# Patient Record
Sex: Female | Born: 1955
Health system: Southern US, Community
[De-identification: ages and names within clinical notes are randomized; demographics above are authoritative.]

## PROBLEM LIST (undated history)

## (undated) DIAGNOSIS — S82891A Other fracture of right lower leg, initial encounter for closed fracture: Secondary | ICD-10-CM

## (undated) DIAGNOSIS — N200 Calculus of kidney: Secondary | ICD-10-CM

## (undated) DIAGNOSIS — F419 Anxiety disorder, unspecified: Secondary | ICD-10-CM

## (undated) DIAGNOSIS — T4145XA Adverse effect of unspecified anesthetic, initial encounter: Secondary | ICD-10-CM

## (undated) DIAGNOSIS — M1990 Unspecified osteoarthritis, unspecified site: Secondary | ICD-10-CM

## (undated) DIAGNOSIS — M797 Fibromyalgia: Secondary | ICD-10-CM

## (undated) DIAGNOSIS — Z9889 Other specified postprocedural states: Secondary | ICD-10-CM

## (undated) DIAGNOSIS — R896 Abnormal cytological findings in specimens from other organs, systems and tissues: Secondary | ICD-10-CM

## (undated) DIAGNOSIS — F329 Major depressive disorder, single episode, unspecified: Secondary | ICD-10-CM

## (undated) DIAGNOSIS — M719 Bursopathy, unspecified: Secondary | ICD-10-CM

## (undated) DIAGNOSIS — E039 Hypothyroidism, unspecified: Secondary | ICD-10-CM

## (undated) DIAGNOSIS — T8859XA Other complications of anesthesia, initial encounter: Secondary | ICD-10-CM

## (undated) DIAGNOSIS — I10 Essential (primary) hypertension: Secondary | ICD-10-CM

## (undated) DIAGNOSIS — F32A Depression, unspecified: Secondary | ICD-10-CM

## (undated) DIAGNOSIS — R112 Nausea with vomiting, unspecified: Secondary | ICD-10-CM

## (undated) DIAGNOSIS — N951 Menopausal and female climacteric states: Secondary | ICD-10-CM

## (undated) DIAGNOSIS — N3010 Interstitial cystitis (chronic) without hematuria: Secondary | ICD-10-CM

## (undated) HISTORY — DX: Hypothyroidism, unspecified: E03.9

## (undated) HISTORY — DX: Calculus of kidney: N20.0

## (undated) HISTORY — DX: Depression, unspecified: F32.A

## (undated) HISTORY — PX: CARPAL TUNNEL RELEASE: SHX101

## (undated) HISTORY — DX: Interstitial cystitis (chronic) without hematuria: N30.10

## (undated) HISTORY — DX: Menopausal and female climacteric states: N95.1

## (undated) HISTORY — DX: Bursopathy, unspecified: M71.9

## (undated) HISTORY — DX: Other fracture of right lower leg, initial encounter for closed fracture: S82.891A

## (undated) HISTORY — DX: Abnormal cytological findings in specimens from other organs, systems and tissues: R89.6

## (undated) HISTORY — DX: Major depressive disorder, single episode, unspecified: F32.9

## (undated) HISTORY — DX: Essential (primary) hypertension: I10

## (undated) HISTORY — PX: OOPHORECTOMY: SHX86

## (undated) HISTORY — PX: ABDOMINAL HYSTERECTOMY: SHX81

## (undated) HISTORY — PX: PELVIC LAPAROSCOPY: SHX162

---

## 2001-03-05 ENCOUNTER — Other Ambulatory Visit: Admission: RE | Admit: 2001-03-05 | Discharge: 2001-03-05 | Payer: Self-pay | Admitting: Gynecology

## 2002-02-25 ENCOUNTER — Other Ambulatory Visit: Admission: RE | Admit: 2002-02-25 | Discharge: 2002-02-25 | Payer: Self-pay | Admitting: Gynecology

## 2003-03-11 ENCOUNTER — Other Ambulatory Visit: Admission: RE | Admit: 2003-03-11 | Discharge: 2003-03-11 | Payer: Self-pay | Admitting: Gynecology

## 2003-07-09 HISTORY — PX: BLADDER SUSPENSION: SHX72

## 2004-03-14 ENCOUNTER — Other Ambulatory Visit: Admission: RE | Admit: 2004-03-14 | Discharge: 2004-03-14 | Payer: Self-pay | Admitting: Gynecology

## 2004-07-18 ENCOUNTER — Other Ambulatory Visit: Admission: RE | Admit: 2004-07-18 | Discharge: 2004-07-18 | Payer: Self-pay | Admitting: Gynecology

## 2004-11-07 ENCOUNTER — Other Ambulatory Visit: Admission: RE | Admit: 2004-11-07 | Discharge: 2004-11-07 | Payer: Self-pay | Admitting: Gynecology

## 2004-12-10 ENCOUNTER — Observation Stay (HOSPITAL_COMMUNITY): Admission: RE | Admit: 2004-12-10 | Discharge: 2004-12-11 | Payer: Self-pay | Admitting: Urology

## 2005-02-13 ENCOUNTER — Ambulatory Visit (HOSPITAL_BASED_OUTPATIENT_CLINIC_OR_DEPARTMENT_OTHER): Admission: RE | Admit: 2005-02-13 | Discharge: 2005-02-13 | Payer: Self-pay | Admitting: Urology

## 2005-02-13 ENCOUNTER — Ambulatory Visit (HOSPITAL_COMMUNITY): Admission: RE | Admit: 2005-02-13 | Discharge: 2005-02-13 | Payer: Self-pay | Admitting: Urology

## 2005-04-10 ENCOUNTER — Other Ambulatory Visit: Admission: RE | Admit: 2005-04-10 | Discharge: 2005-04-10 | Payer: Self-pay | Admitting: Gynecology

## 2005-07-08 DIAGNOSIS — IMO0001 Reserved for inherently not codable concepts without codable children: Secondary | ICD-10-CM

## 2005-07-08 HISTORY — DX: Reserved for inherently not codable concepts without codable children: IMO0001

## 2005-07-15 ENCOUNTER — Other Ambulatory Visit: Admission: RE | Admit: 2005-07-15 | Discharge: 2005-07-15 | Payer: Self-pay | Admitting: Gynecology

## 2005-08-26 ENCOUNTER — Encounter: Admission: RE | Admit: 2005-08-26 | Discharge: 2005-08-26 | Payer: Self-pay | Admitting: Gynecology

## 2006-04-11 ENCOUNTER — Other Ambulatory Visit: Admission: RE | Admit: 2006-04-11 | Discharge: 2006-04-11 | Payer: Self-pay | Admitting: Gynecology

## 2006-10-17 ENCOUNTER — Other Ambulatory Visit: Admission: RE | Admit: 2006-10-17 | Discharge: 2006-10-17 | Payer: Self-pay | Admitting: Gynecology

## 2007-03-18 ENCOUNTER — Encounter: Admission: RE | Admit: 2007-03-18 | Discharge: 2007-03-18 | Payer: Self-pay | Admitting: Gynecology

## 2007-04-17 ENCOUNTER — Other Ambulatory Visit: Admission: RE | Admit: 2007-04-17 | Discharge: 2007-04-17 | Payer: Self-pay | Admitting: Gynecology

## 2008-03-17 ENCOUNTER — Ambulatory Visit (HOSPITAL_BASED_OUTPATIENT_CLINIC_OR_DEPARTMENT_OTHER): Admission: RE | Admit: 2008-03-17 | Discharge: 2008-03-17 | Payer: Self-pay | Admitting: Orthopedic Surgery

## 2008-04-18 ENCOUNTER — Ambulatory Visit: Payer: Self-pay | Admitting: Women's Health

## 2008-04-18 ENCOUNTER — Encounter: Payer: Self-pay | Admitting: Women's Health

## 2008-04-18 ENCOUNTER — Other Ambulatory Visit: Admission: RE | Admit: 2008-04-18 | Discharge: 2008-04-18 | Payer: Self-pay | Admitting: Gynecology

## 2008-05-13 ENCOUNTER — Encounter: Admission: RE | Admit: 2008-05-13 | Discharge: 2008-05-13 | Payer: Self-pay | Admitting: Gynecology

## 2009-02-17 ENCOUNTER — Encounter: Admission: RE | Admit: 2009-02-17 | Discharge: 2009-02-17 | Payer: Self-pay | Admitting: Family Medicine

## 2009-04-20 ENCOUNTER — Other Ambulatory Visit: Admission: RE | Admit: 2009-04-20 | Discharge: 2009-04-20 | Payer: Self-pay | Admitting: Gynecology

## 2009-04-20 ENCOUNTER — Encounter: Payer: Self-pay | Admitting: Women's Health

## 2009-04-20 ENCOUNTER — Ambulatory Visit: Payer: Self-pay | Admitting: Women's Health

## 2009-05-08 HISTORY — PX: TOE SURGERY: SHX1073

## 2009-07-03 ENCOUNTER — Encounter: Admission: RE | Admit: 2009-07-03 | Discharge: 2009-07-03 | Payer: Self-pay | Admitting: Gynecology

## 2010-02-07 ENCOUNTER — Encounter: Admission: RE | Admit: 2010-02-07 | Discharge: 2010-02-07 | Payer: Self-pay | Admitting: Orthopedic Surgery

## 2010-04-30 ENCOUNTER — Other Ambulatory Visit: Admission: RE | Admit: 2010-04-30 | Discharge: 2010-04-30 | Payer: Self-pay | Admitting: Gynecology

## 2010-04-30 ENCOUNTER — Ambulatory Visit: Payer: Self-pay | Admitting: Women's Health

## 2010-06-27 ENCOUNTER — Ambulatory Visit: Payer: Self-pay | Admitting: Women's Health

## 2010-07-06 ENCOUNTER — Encounter
Admission: RE | Admit: 2010-07-06 | Discharge: 2010-07-06 | Payer: Self-pay | Source: Home / Self Care | Attending: Obstetrics and Gynecology | Admitting: Obstetrics and Gynecology

## 2010-10-20 ENCOUNTER — Emergency Department: Payer: Self-pay | Admitting: Emergency Medicine

## 2010-11-03 ENCOUNTER — Emergency Department: Payer: Self-pay | Admitting: Emergency Medicine

## 2010-11-23 NOTE — Op Note (Signed)
NAMEKOURTNI, STINEMAN                  ACCOUNT NO.:  [id number]   MEDICAL RECORD NO.:  [id number]          PATIENT TYPE:  OBV   LOCATION:  1424                         FACILITY:  [facility]   PHYSICIAN:  Maretta Bees. Vonita Moss, M.D.DATE OF BIRTH:  1955-10-10   DATE OF PROCEDURE:  12/10/2004  DATE OF DISCHARGE:                                 OPERATIVE REPORT   PREOPERATIVE DIAGNOSIS:  Stress urinary incontinence.   POSTOPERATIVE DIAGNOSES:  Stress urinary incontinence.   PROCEDURE:  Obturator sling insertion.   SURGEON:  Dr. Larey Dresser   ANESTHESIA:  General.   INDICATIONS:  This 55 year old white female with interstitial cystitis has a  history of stress type incontinence and she was advised about the various  ways to correct this problem and she opted for obturator sling insertion.   DESCRIPTION OF PROCEDURE:  The patient was brought to the operating room,  placed in lithotomy position. The external genitalia were prepped and draped  in the usual fashion. A Foley catheter was put in the bladder. A suburethral  vaginal incision was made in mid urethra after injecting Xylocaine with epi.  The Strulle scissors are used to dissect laterally and then digital  dissection allowed access to the endopelvic fascia and obturator bone. The  curved needle passer with the obturator sling was then passed after making  stab wounds laterally to the vaginal area at the level of the clitoris. The  needle was marched along the backside of the obturator fossa and then  brought in mid urethra on each side. This was somewhat difficult because of  a kind of a narrow pubic arch and a urethra that was somewhat retracted  vaginally, but nonetheless we ended up with a very good needle passage and  positioning of the sling in mid urethra. She was cystoscoped and there was  no evidence of bladder or urethral injury. The sling was then positioned  with a hemostat between the urethra with a Foley catheter in it and  the  sling itself. The excess redundant  sling was cut off at skin level and the  left stab wounds are later closed with Dermabond. The vaginal incision was  closed with a running this 2-0 Vicryl and there was a lateral tear in the  mucosa on the left side that was also closed with running 2-0 Vicryl but at  this time the vaginal closure was complete with no palpable or visible sling  material. The Foley catheter was connected to closed drainage, vaginal pack  was placed in position and she was taken to the recovery room in good  condition with a sponge, needle, instrument count that is correct, and  estimated blood loss of 100 mL having tolerated the procedure well.     _________________    LJP/MEDQ  D:  12/10/2004  T:  12/11/2004  Job:  [id number]

## 2010-11-23 NOTE — Op Note (Signed)
NAMEHOLLY, [name]                  ACCOUNT NO.:  [id number]   MEDICAL RECORD NO.:  [id number]          PATIENT TYPE:  AMB   LOCATION:  NESC                         FACILITY:  [facility]   PHYSICIAN:  Bertram Millard. Dahlstedt, M.D.DATE OF BIRTH:  1956/04/29   DATE OF PROCEDURE:  02/13/2005  DATE OF DISCHARGE:                                 OPERATIVE REPORT   PREOPERATIVE DIAGNOSIS:  Extrusion of obturator sling   POSTOPERATIVE DIAGNOSIS:  Extrusion of obturator sling.   PRINCIPAL PROCEDURE:  Excision of extruded vaginal sling.   SURGEON:  Bertram Millard. Dahlstedt, M.D.   ANESTHESIA:  General with LMA.   COMPLICATIONS:  None.   ESTIMATED BLOOD LOSS:  Less than 50 cc.   SPECIMENS:  None.   BRIEF HISTORY:  A 55 year old female followed by Dr. Vonita Moss. She has been  treated for interstitial cystitis, and actually also had stress urinary  incontinence. She underwent an obturator sling in June of this year. It has  worked well for her stress incontinence. She was recently noted to have  painful intercourse, and examination thereafter revealed extrusion of the  sling material. The patient presents at this time for excision of this  extruded sling. She is aware of the risks and complications and desires to  proceed.   DESCRIPTION OF PROCEDURE:  The patient was administered preoperative IV  antibiotics and taken to the operating room where general anesthetic was  administered. She was placed in the dorsal lithotomy position. Genitalia and  perineum were prepped and draped. A posterior vaginal speculum was placed. A  catheter was placed transurethrally and her bladder drained. Inspection of  the vagina revealed a 2 cm area of erosion of the sling through the vaginal  wall on the left side, near the vaginal fornix. The vaginal mucosa around  this area was infiltrated with 10 cc of 1% lidocaine with epinephrine. I  then dissected around the sling, and was able to get a right angle around  it. I  then incised it in the middle. Dissection was then first carried  medially. The vaginal mucosa around this area was carefully dissected,  undermining the mucosa, overlying and around the sling. Once I got a good  length of sling dissected, I then excised the sling, with the medial aspect  being over the urethra. Palpating this area, deep underlying the mucosal  flap, I was not able to palpate any more sling tissue. I then turned  attention to the left side of the sling. This was dissected up into the  paravaginal tissue. About 1 cm of this tissue of the sling tissue was  exposed, undermining the vaginal tissue around it. I then cut beneath the  paravaginal tissue and excised that part of the sling. I could not palpate  any fibers of the sling at that point. Inspection revealed adequate  hemostasis of the underlying tissues. I  freshened up the edges of the  incision, removing redundant tissue. This incised/dissected area was then  closed using a running 2-0 Vicryl. Hemostasis was excellent. At this point,  a maternity pad was placed.  The patient tolerated the procedure well, was  awakened, and taken to PACU in stable condition.   The patient was discharged on Diflucan 150 mg 1 p.o. daily p.r.n. yeast  infections (#2), Keflex 250 mg 1 p.o. t.i.d. (#10), Darvocet-N 100 1-2 p.o.  q.4 h. pain (#20), and Estrace vaginal cream apply locally inside the vagina  every other night (42-gram tube).   The patient will follow up in approximately two weeks. She was given  discharge instructions.      Bertram Millard. Dahlstedt, M.D.  Electronically Signed     SMD/MEDQ  D:  02/13/2005  T:  02/13/2005  Job:  [id number]

## 2011-02-19 ENCOUNTER — Encounter (HOSPITAL_COMMUNITY)
Admission: RE | Admit: 2011-02-19 | Discharge: 2011-02-19 | Disposition: A | Discharge status: Home / Self Care | Payer: BC Managed Care – PPO | Source: Ambulatory Visit | Attending: Neurosurgery | Admitting: Neurosurgery

## 2011-02-19 LAB — CBC
Hemoglobin: 14.1 g/dL (ref 12.0–15.0)
MCH: 30.3 pg (ref 26.0–34.0)
MCHC: 34.6 g/dL (ref 30.0–36.0)
MCV: 87.6 fL (ref 78.0–100.0)
Platelets: 303 10*3/uL (ref 150–400)
RBC: 4.66 MIL/uL (ref 3.87–5.11)

## 2011-02-19 LAB — BASIC METABOLIC PANEL
BUN: 16 mg/dL (ref 6–23)
CO2: 28 mEq/L (ref 19–32)
GFR calc Af Amer: >60 mL/min (ref >60)
GFR calc non Af Amer: >60 mL/min (ref >60)
Glucose, Bld: 83 mg/dL (ref 70–99)
Potassium: 3.9 mEq/L (ref 3.5–5.1)
Sodium: 138 mEq/L (ref 135–145)

## 2011-02-19 LAB — URINALYSIS, ROUTINE W REFLEX MICROSCOPIC
Bilirubin Urine: NEGATIVE
Leukocytes, UA: NEGATIVE
Nitrite: NEGATIVE
Specific Gravity, Urine: 1.015 (ref 1.005–1.030)

## 2011-02-19 LAB — PROTIME-INR: Prothrombin Time: 13.2 seconds (ref 11.6–15.2)

## 2011-02-19 LAB — APTT: aPTT: 38 seconds — ABNORMAL HIGH (ref 24–37)

## 2011-02-26 ENCOUNTER — Ambulatory Visit (HOSPITAL_COMMUNITY)
Admission: RE | Admit: 2011-02-26 | Discharge: 2011-02-26 | Disposition: A | Discharge status: Home / Self Care | Payer: BC Managed Care – PPO | Source: Ambulatory Visit | Attending: Neurosurgery | Admitting: Neurosurgery

## 2011-02-26 ENCOUNTER — Other Ambulatory Visit (HOSPITAL_COMMUNITY): Payer: Self-pay | Admitting: Neurosurgery

## 2011-02-26 DIAGNOSIS — I10 Essential (primary) hypertension: Secondary | ICD-10-CM | POA: Insufficient documentation

## 2011-02-26 DIAGNOSIS — Z01818 Encounter for other preprocedural examination: Secondary | ICD-10-CM | POA: Insufficient documentation

## 2011-02-26 DIAGNOSIS — Z01812 Encounter for preprocedural laboratory examination: Secondary | ICD-10-CM | POA: Insufficient documentation

## 2011-02-26 DIAGNOSIS — Z01811 Encounter for preprocedural respiratory examination: Secondary | ICD-10-CM

## 2011-02-26 DIAGNOSIS — G5600 Carpal tunnel syndrome, unspecified upper limb: Secondary | ICD-10-CM | POA: Insufficient documentation

## 2011-02-26 DIAGNOSIS — F341 Dysthymic disorder: Secondary | ICD-10-CM | POA: Insufficient documentation

## 2011-02-28 NOTE — Op Note (Signed)
  NAME:  [name], [name] NO.:  [id number]  MEDICAL RECORD NO.:  [id number]  LOCATION:  XRAY                         FACILITY:  [facility]  PHYSICIAN:  Clydene Fake, M.D.  DATE OF BIRTH:  [birth year]  DATE OF PROCEDURE:  02/26/2011 DATE OF DISCHARGE:                              OPERATIVE REPORT   PREOPERATIVE DIAGNOSIS:  Left carpal tunnel syndrome.  POSTOPERATIVE DIAGNOSIS:  Left carpal tunnel syndrome.  PROCEDURE:  Left carpal tunnel release.  SURGEON:  Clydene Fake, MD.  ANESTHESIA:  MAC with local.  ESTIMATED BLOOD LOSS:  Minimal.  BLOOD GIVEN:  None.  DRAINS:  None.  COMPLICATIONS:  None.  REASON FOR PROCEDURE:  The patient is a 55 year old woman who has had left hand pain and numbness, positive Tinel's on exam, found to have left carpal tunnel syndrome on EMG nerve conduction velocities.  The patient elected to proceed with carpal tunnel release due to her symptoms and we brought to the operating room for this.  PROCEDURE IN DETAIL:  The patient was brought to the operating room and the patient was sedated by anesthesia, and prepped and draped in usual sterile fashion.  Site of incision was injected with 27 mL of 1% lidocaine and then a S-shaped incision was made over the left wrist. Incision was taken down to the flexor retinaculum.  Hemostasis obtained with bipolar cauterization.  The ligament was then incised and we found the median nerve and protected with the [facility] as we continued to transecting the ligaments, making sure we went distal enough to get compression distally and also proximally.  When we were finished, we had good decompression of nerve.  Good hemostasis with bipolar cauterization.  We irrigated with antibiotic solution and then the incision was closed with 0 nylon, mattress, and simple sutures.  Dressing was then placed.  Kerlix and Ace wrap was placed and she is transferred to recovery room in stable condition.        ______________________________ Clydene Fake, M.D.     JRH/MEDQ  D:  02/26/2011  T:  02/26/2011  Job:  [id number]  Electronically Signed by Colon Branch M.D. on 02/28/2011 12:11:49 PM

## 2011-04-10 LAB — POCT I-STAT, CHEM 8
BUN: 13
Creatinine, Ser: 0.8
Glucose, Bld: 88
HCT: 46

## 2011-04-29 DIAGNOSIS — N951 Menopausal and female climacteric states: Secondary | ICD-10-CM | POA: Insufficient documentation

## 2011-04-29 DIAGNOSIS — IMO0001 Reserved for inherently not codable concepts without codable children: Secondary | ICD-10-CM | POA: Insufficient documentation

## 2011-04-29 DIAGNOSIS — E039 Hypothyroidism, unspecified: Secondary | ICD-10-CM | POA: Insufficient documentation

## 2011-04-29 DIAGNOSIS — N3010 Interstitial cystitis (chronic) without hematuria: Secondary | ICD-10-CM | POA: Insufficient documentation

## 2011-04-29 DIAGNOSIS — I10 Essential (primary) hypertension: Secondary | ICD-10-CM | POA: Insufficient documentation

## 2011-04-29 DIAGNOSIS — F32A Depression, unspecified: Secondary | ICD-10-CM | POA: Insufficient documentation

## 2011-04-29 DIAGNOSIS — F329 Major depressive disorder, single episode, unspecified: Secondary | ICD-10-CM | POA: Insufficient documentation

## 2011-05-02 ENCOUNTER — Encounter: Payer: BC Managed Care – PPO | Admitting: Women's Health

## 2011-05-06 ENCOUNTER — Encounter: Payer: Self-pay | Admitting: Women's Health

## 2011-05-06 ENCOUNTER — Ambulatory Visit (INDEPENDENT_AMBULATORY_CARE_PROVIDER_SITE_OTHER): Payer: BC Managed Care – PPO | Admitting: Women's Health

## 2011-05-06 ENCOUNTER — Other Ambulatory Visit (HOSPITAL_COMMUNITY)
Admission: RE | Admit: 2011-05-06 | Discharge: 2011-05-06 | Disposition: A | Discharge status: Home / Self Care | Payer: BC Managed Care – PPO | Source: Ambulatory Visit | Attending: Family Medicine | Admitting: Family Medicine

## 2011-05-06 VITALS — BP 124/72 | Ht 62.0 in | Wt 200.0 lb

## 2011-05-06 DIAGNOSIS — F411 Generalized anxiety disorder: Secondary | ICD-10-CM

## 2011-05-06 DIAGNOSIS — Z780 Asymptomatic menopausal state: Secondary | ICD-10-CM

## 2011-05-06 DIAGNOSIS — N951 Menopausal and female climacteric states: Secondary | ICD-10-CM

## 2011-05-06 DIAGNOSIS — F419 Anxiety disorder, unspecified: Secondary | ICD-10-CM

## 2011-05-06 DIAGNOSIS — Z01419 Encounter for gynecological examination (general) (routine) without abnormal findings: Secondary | ICD-10-CM

## 2011-05-06 MED ORDER — ALPRAZOLAM 0.25 MG PO TABS: 0.2500 mg | ORAL_TABLET | Freq: Three times a day (TID) | ORAL | Status: AC | PRN

## 2011-05-06 MED ORDER — ESTRADIOL 0.1 MG/24HR TD PTTW: 1.0000 | MEDICATED_PATCH | TRANSDERMAL | Status: DC

## 2011-05-06 NOTE — Progress Notes (Signed)
[name] Mar 25, 1956 [id number]    History:    The patient presents for annual exam.  Had to place mother in a long-term care facility, age 55, inability to care for herself.   Past medical history, past surgical history, family history and social history were all reviewed and documented in the EPIC chart.   ROS:  A  ROS was performed and pertinent positives and negatives are included in the history.  Exam:  Filed Vitals:   05/06/11 1621  BP: 124/72    General appearance:  Normal Head/Neck:  Normal, without cervical or supraclavicular adenopathy. Thyroid:  Symmetrical, normal in size, without palpable masses or nodularity. Respiratory  Effort:  Normal  Auscultation:  Clear without wheezing or rhonchi Cardiovascular  Auscultation:  Regular rate, without rubs, murmurs or gallops  Edema/varicosities:  Not grossly evident Abdominal  Soft,nontender, without masses, guarding or rebound.  Liver/spleen:  No organomegaly noted  Hernia:  None appreciated  Skin  Inspection:  Grossly normal  Palpation:  Grossly normal Neurologic/psychiatric  Orientation:  Normal with appropriate conversation.  Mood/affect:  Normal  Genitourinary    Breasts: Examined lying and sitting.     Right: Without masses, retractions, discharge or axillary adenopathy.     Left: Without masses, retractions, discharge or axillary adenopathy.   Inguinal/mons:  Normal without inguinal adenopathy  External genitalia:  Normal  BUS/Urethra/Skene's glands:  Normal  Bladder:  Normal  Vagina:  Normal  Cervix:  absent  Uterus:  absent  Adnexa/parametria:     Rt: Without masses or tenderness.   Lt: Without masses or tenderness.  Anus and perineum: Normal  Digital rectal exam: Normal sphincter tone without palpated masses or tenderness  Assessment/Plan:  55 y.o. DWF G1P1 for annual exam.  History of a TAH with LSO for endometriosis on Vivelle-Dot 0.1 mg twice weekly. Normal DEXA 12/11  Postmenopausal on  ERT Hypertension/hypothyroidism/depression-labs and medications primary care  Plan: Vivelle-Dot 0.1 mg twice weekly, prescription, proper use, slight risk for blood clots, strokes and breast cancer was reviewed. States continues to have hot flushes and would like to continue. States unable to tolerate the generic, states falls off and skin irritation. SBEs, annual mammogram which was normal in December 2011. Encouraged to cut calories, increase exercise for weight loss and general health. Has never had a colonoscopy, did review importance of screening, states will schedule this year. Struggling with having to place her mother in a nursing home, states needs something for her nerves. Will try Xanax 0.25, reviewed addictive properties to use sparingly. Is on Effexor 150 mg per primary care and did review importance of staying on at this time   .Harrington Challenger [facility], 5:13 PM 05/06/2011

## 2011-05-09 NOTE — Progress Notes (Signed)
Patient called to say [place] had sent rx for Xanax to her pharmacy at [facility].  Xanax cannot be sent e-scribe. Must be written RX or called in. I called it to her pharmacy as [place] prescribed it.

## 2011-08-13 ENCOUNTER — Other Ambulatory Visit: Payer: Self-pay | Admitting: Obstetrics and Gynecology

## 2011-08-13 DIAGNOSIS — Z1231 Encounter for screening mammogram for malignant neoplasm of breast: Secondary | ICD-10-CM

## 2011-08-21 ENCOUNTER — Ambulatory Visit
Admission: RE | Admit: 2011-08-21 | Discharge: 2011-08-21 | Disposition: A | Discharge status: Home / Self Care | Payer: BC Managed Care – PPO | Source: Ambulatory Visit | Attending: Obstetrics and Gynecology | Admitting: Obstetrics and Gynecology

## 2011-08-21 DIAGNOSIS — Z1231 Encounter for screening mammogram for malignant neoplasm of breast: Secondary | ICD-10-CM

## 2012-06-01 ENCOUNTER — Other Ambulatory Visit: Payer: Self-pay | Admitting: Women's Health

## 2012-06-08 ENCOUNTER — Ambulatory Visit (INDEPENDENT_AMBULATORY_CARE_PROVIDER_SITE_OTHER): Payer: BC Managed Care – PPO | Admitting: Women's Health

## 2012-06-08 ENCOUNTER — Encounter: Payer: Self-pay | Admitting: Women's Health

## 2012-06-08 VITALS — BP 144/96 | Ht 66.0 in | Wt 188.0 lb

## 2012-06-08 DIAGNOSIS — Z01419 Encounter for gynecological examination (general) (routine) without abnormal findings: Secondary | ICD-10-CM

## 2012-06-08 DIAGNOSIS — Z113 Encounter for screening for infections with a predominantly sexual mode of transmission: Secondary | ICD-10-CM

## 2012-06-08 DIAGNOSIS — Z79890 Hormone replacement therapy (postmenopausal): Secondary | ICD-10-CM

## 2012-06-08 MED ORDER — ESTRADIOL 0.1 MG/24HR TD PTTW: 1.0000 | MEDICATED_PATCH | TRANSDERMAL | Status: DC

## 2012-06-08 NOTE — Progress Notes (Signed)
[name] 1956/11/07 [id number]    History:    The patient presents for annual exam.  TAH with LSO for endometriosis 1987 on Vivelle dot 0.1 mg twice weekly. History of a bladder sling 2005. DEXA 06/2010 normal - T score hip average 0.6. History of LGSIL/CIN-1 2005 with negative biopsy,  negative HR HPV 2007 with normal Paps since 2008. Normal mammogram history. History of hypertension and hypothyroidism-primary care manages.  Past medical history, past surgical history, family history and social history were all reviewed and documented in the EPIC chart. New partner. Daughter [name] planning wedding this summer. Works for an [employer], [employer].   ROS:  A  ROS was performed and pertinent positives and negatives are included in the history.  Exam:  Filed Vitals:   06/08/12 1436  BP: 144/96    General appearance:  Normal Head/Neck:  Normal, without cervical or supraclavicular adenopathy. Thyroid:  Symmetrical, normal in size, without palpable masses or nodularity. Respiratory  Effort:  Normal  Auscultation:  Clear without wheezing or rhonchi Cardiovascular  Auscultation:  Regular rate, without rubs, murmurs or gallops  Edema/varicosities:  Not grossly evident Abdominal  Soft,nontender, without masses, guarding or rebound.  Liver/spleen:  No organomegaly noted  Hernia:  None appreciated  Skin  Inspection:  Grossly normal  Palpation:  Grossly normal Neurologic/psychiatric  Orientation:  Normal with appropriate conversation.  Mood/affect:  Normal  Genitourinary    Breasts: Examined lying and sitting.     Right: Without masses, retractions, discharge or axillary adenopathy.     Left: Without masses, retractions, discharge or axillary adenopathy.   Inguinal/mons:  Normal without inguinal adenopathy  External genitalia:  Normal  BUS/Urethra/Skene's glands:  Normal  Bladder:  Normal  Vagina:  Normal  Cervix:  Absent Uterus: Absent  Adnexa/parametria:     Rt: Without masses or  tenderness.   Lt: Without masses or tenderness.  Anus and perineum: Normal  Digital rectal exam: Normal sphincter tone without palpated masses or tenderness  Assessment/Plan:  56 y.o. DW F. G1 P1 for annual exam with complaint of vaginal dryness.     STD screen Postmenopausal TAH with LSO on ERT Hypothyroid/hypertension/depression-primary care labs and meds Obesity  Plan: GC/Chlamydia, HIV, hep B, C., RPR. Pap normal 2012, new screening guidelines reviewed.  Vivelle dot 0.1 mg twice weekly, prescription, proper use, slight risk for blood clots, strokes, breast cancer reviewed. States continues with hot flushes, not daily. SBE's, continue annual mammogram, calcium rich diet, vitamin D 2000 daily, decrease calories and increase exercise for weight loss encouraged. Has not had a screening colonoscopy and did review importance. Home Hemoccult card given. Encouraged vaginal lubricants with intercourse.    Harrington Challenger [facility], 5:32 PM 06/08/2012

## 2012-06-08 NOTE — Patient Instructions (Addendum)

## 2012-06-09 ENCOUNTER — Encounter: Payer: Self-pay | Admitting: Women's Health

## 2012-06-09 LAB — RPR

## 2012-06-19 ENCOUNTER — Encounter: Payer: Self-pay | Admitting: Gynecology

## 2012-06-25 ENCOUNTER — Telehealth: Payer: Self-pay | Admitting: *Deleted

## 2012-06-25 NOTE — Telephone Encounter (Signed)
Left on pt voicemail below left up front for pick up.

## 2012-06-25 NOTE — Telephone Encounter (Signed)
We do have sample with web site to order, I put on your desk.  thanks

## 2012-06-25 NOTE — Telephone Encounter (Signed)
Pt called and said that the hyalo gyn gel worked great, she would like Rx for this. I check to see if we had samples and didn't see any. Please advise

## 2012-07-08 HISTORY — PX: OTHER SURGICAL HISTORY: SHX169

## 2012-08-10 ENCOUNTER — Other Ambulatory Visit: Payer: Self-pay | Admitting: Gynecology

## 2012-08-10 DIAGNOSIS — Z1231 Encounter for screening mammogram for malignant neoplasm of breast: Secondary | ICD-10-CM

## 2012-09-01 ENCOUNTER — Ambulatory Visit
Admission: RE | Admit: 2012-09-01 | Discharge: 2012-09-01 | Disposition: A | Discharge status: Home / Self Care | Payer: BC Managed Care – PPO | Source: Ambulatory Visit | Attending: Gynecology | Admitting: Gynecology

## 2012-09-01 DIAGNOSIS — Z1231 Encounter for screening mammogram for malignant neoplasm of breast: Secondary | ICD-10-CM

## 2013-04-15 ENCOUNTER — Telehealth: Payer: Self-pay | Admitting: *Deleted

## 2013-04-15 NOTE — Telephone Encounter (Signed)
Pt currently taking vivelle dot patch 0.1 mg twice weekly has been on for years now. Pt said her PCP increase her Effexor 150 mg daily, pt is c/o being emotional, wanting to cry at times, night sweats x 2 months now. Pt said she is typically a happy person but feels if her hormones are off. Annual due Dec, pt requesting if hormone levels could checked? OV? Please advise

## 2013-04-15 NOTE — Telephone Encounter (Signed)
Pt informed with the below note. 

## 2013-04-15 NOTE — Telephone Encounter (Signed)
Office visit best 

## 2013-04-16 ENCOUNTER — Ambulatory Visit (INDEPENDENT_AMBULATORY_CARE_PROVIDER_SITE_OTHER): Payer: BC Managed Care – PPO | Admitting: Women's Health

## 2013-04-16 ENCOUNTER — Encounter: Payer: Self-pay | Admitting: Women's Health

## 2013-04-16 DIAGNOSIS — N951 Menopausal and female climacteric states: Secondary | ICD-10-CM

## 2013-04-16 NOTE — Progress Notes (Signed)
Patient ID: [name], female   DOB: 12/06/1955, 57 y.o.   MRN: [id number] Presents with daily hot flashes/ drenching night sweats/ emotional crying spells x few months. TAH with LSO on Vivelle 0.1 mg patch for years.  Hypothyroidism, hypertension, depression managed by PC/ taking Synthroid,  Effexor 150 mg all daily. Normal TSH one month ago.  Boyfriend of 1 year/ planning to get married in March, divorced /single >20 years.  Daughter recently married.   Exam: Appears well and enthusiastic about future, but teary occasionally.    Increased Hot Flashes/mood changes with crying Depression Hypothyroidism  Plan: Vivelle 0.1 patch, estradiol level pending. Continue Effexor 150 mg daily.  Encouraged  counseling for impending marriage, both single many years.

## 2013-04-17 LAB — ESTRADIOL: Estradiol: 66.9 pg/mL

## 2013-04-21 ENCOUNTER — Ambulatory Visit: Payer: Self-pay | Admitting: Women's Health

## 2013-05-17 ENCOUNTER — Other Ambulatory Visit: Payer: Self-pay | Admitting: Orthopedic Surgery

## 2013-05-17 DIAGNOSIS — M25571 Pain in right ankle and joints of right foot: Secondary | ICD-10-CM

## 2013-05-19 ENCOUNTER — Ambulatory Visit
Admission: RE | Admit: 2013-05-19 | Discharge: 2013-05-19 | Disposition: A | Discharge status: Home / Self Care | Payer: BC Managed Care – PPO | Source: Ambulatory Visit | Attending: Orthopedic Surgery | Admitting: Orthopedic Surgery

## 2013-05-19 DIAGNOSIS — M25571 Pain in right ankle and joints of right foot: Secondary | ICD-10-CM

## 2013-06-23 ENCOUNTER — Encounter: Payer: Self-pay | Admitting: Women's Health

## 2013-06-23 ENCOUNTER — Ambulatory Visit (INDEPENDENT_AMBULATORY_CARE_PROVIDER_SITE_OTHER): Payer: BC Managed Care – PPO | Admitting: Women's Health

## 2013-06-23 VITALS — BP 124/74 | Ht 62.0 in | Wt 203.0 lb

## 2013-06-23 DIAGNOSIS — Z79890 Hormone replacement therapy (postmenopausal): Secondary | ICD-10-CM

## 2013-06-23 DIAGNOSIS — Z01419 Encounter for gynecological examination (general) (routine) without abnormal findings: Secondary | ICD-10-CM

## 2013-06-23 DIAGNOSIS — Z780 Asymptomatic menopausal state: Secondary | ICD-10-CM

## 2013-06-23 MED ORDER — ESTRADIOL 0.1 MG/24HR TD PTTW: 1.0000 | MEDICATED_PATCH | TRANSDERMAL | Status: DC

## 2013-06-23 NOTE — Progress Notes (Signed)
[name] 09/15/1955 [id number]    History:    The patient presents for annual exam.  TAH with LSO for endometriosis on Vivelle 0.1.twice weekly. Bladder suspension 2005. Normal Pap and mammogram history. Has not had a colonoscopy. Normal DEXA 2011, T score 0.6 Ffractured right ankle with surgery 2014. IC/depression/hypertension/hypothyroid-primary care manages. Same partner/planning marriage.  Past medical history, past surgical history, family history and social history were all reviewed and documented in the EPIC chart. Works for an Scientist, forensic. Only child [name] married this past year. Remodeling and entire [place]. Mother hypertension/ breast cancer 69.  ROS:  A  ROS was performed and pertinent positives and negatives are included in the history.  Exam:  Filed Vitals:   06/23/13 1105  BP: 124/74    General appearance:  Normal Head/Neck:  Normal, without cervical or supraclavicular adenopathy. Thyroid:  Symmetrical, normal in size, without palpable masses or nodularity. Respiratory  Effort:  Normal  Auscultation:  Clear without wheezing or rhonchi Cardiovascular  Auscultation:  Regular rate, without rubs, murmurs or gallops  Edema/varicosities:  Not grossly evident Abdominal  Soft,nontender, without masses, guarding or rebound.  Liver/spleen:  No organomegaly noted  Hernia:  None appreciated  Skin  Inspection:  Grossly normal  Palpation:  Grossly normal Neurologic/psychiatric  Orientation:  Normal with appropriate conversation.  Mood/affect:  Normal  Genitourinary    Breasts: Examined lying and sitting.     Right: Without masses, retractions, discharge or axillary adenopathy.     Left: Without masses, retractions, discharge or axillary adenopathy.   Inguinal/mons:  Normal without inguinal adenopathy  External genitalia:  Normal  BUS/Urethra/Skene's glands:  Normal  Bladder:  Normal  Vagina:  Normal  Cervix: Absent Uterus:  Absent  Adnexa/parametria:      Rt: Without masses or tenderness.   Lt: Without masses or tenderness.  Anus and perineum: Normal  Digital rectal exam: Normal sphincter tone without palpated masses or tenderness  Assessment/Plan:  57 y.o.DWF G1P1  for annual exam with no complaints.  TAH with LSO on HRT IC/depression/hypertension/hypothyroid - primary care manages labs and meds Right foot fracture - traumatic fall/2014  Plan: Repeat DEXA, will schedule. Reviewed importance of screening colonoscopy instructed to schedule. SBE's, continue annual mammogram, calcium rich diet, vitamin D 2000 daily encouraged. Reviewed importance of increasing regular exercise ,  decreasing  calories for weight loss.  Vivelle dot 0.1 mg twice weekly prescription, proper use, risk for blood clots, strokes, breast cancer reviewed. Has numerous flushes when off.   Harrington Challenger [facility], 12:10 PM 06/23/2013

## 2013-06-23 NOTE — Patient Instructions (Signed)
Health Recommendations for Postmenopausal Women Respected and ongoing research has looked at the most common causes of death, disability, and poor quality of life in postmenopausal women. The causes include heart disease, diseases of blood vessels, diabetes, depression, cancer, and bone loss (osteoporosis). Many things can be done to help lower the chances of developing these and other common problems: CARDIOVASCULAR DISEASE Heart Disease: A heart attack is a medical emergency. Know the signs and symptoms of a heart attack. Below are things women can do to reduce their risk for heart disease.   Do not smoke. If you smoke, quit.  Aim for a healthy weight. Being overweight causes many preventable deaths. Eat a healthy and balanced diet and drink an adequate amount of liquids.  Get moving. Make a commitment to be more physically active. Aim for 30 minutes of activity on most, if not all days of the week.  Eat for heart health. Choose a diet that is low in saturated fat and cholesterol and eliminate trans fat. Include whole grains, vegetables, and fruits. Read and understand the labels on food containers before buying.  Know your numbers. Ask your caregiver to check your blood pressure, cholesterol (total, HDL, LDL, triglycerides) and blood glucose. Work with your caregiver on improving your entire clinical picture.  High blood pressure. Limit or stop your table salt intake (try salt substitute and food seasonings). Avoid salty foods and drinks. Read labels on food containers before buying. Eating well and exercising can help control high blood pressure. STROKE  Stroke is a medical emergency. Stroke may be the result of a blood clot in a blood vessel in the brain or by a brain hemorrhage (bleeding). Know the signs and symptoms of a stroke. To lower the risk of developing a stroke:  Avoid fatty foods.  Quit smoking.  Control your diabetes, blood pressure, and irregular heart rate. THROMBOPHLEBITIS  (BLOOD CLOT) OF THE LEG  Becoming overweight and leading a stationary lifestyle may also contribute to developing blood clots. Controlling your diet and exercising will help lower the risk of developing blood clots. CANCER SCREENING  Breast Cancer: Take steps to reduce your risk of breast cancer.  You should practice "breast self-awareness." This means understanding the normal appearance and feel of your breasts and should include breast self-examination. Any changes detected, no matter how small, should be reported to your caregiver.  After age 40, you should have a clinical breast exam (CBE) every year.  Starting at age 40, you should consider having a mammogram (breast X-ray) every year.  If you have a family history of breast cancer, talk to your caregiver about genetic screening.  If you are at high risk for breast cancer, talk to your caregiver about having an MRI and a mammogram every year.  Intestinal or Stomach Cancer: Tests to consider are a rectal exam, fecal occult blood, sigmoidoscopy, and colonoscopy. Women who are high risk may need to be screened at an earlier age and more often.  Cervical Cancer:  Beginning at age 30, you should have a Pap test every 3 years as long as the past 3 Pap tests have been normal.  If you have had past treatment for cervical cancer or a condition that could lead to cancer, you need Pap tests and screening for cancer for at least 20 years after your treatment.  If you had a hysterectomy for a problem that was not cancer or a condition that could lead to cancer, then you no longer need Pap tests.    If you are between ages 65 and 70, and you have had normal Pap tests going back 10 years, you no longer need Pap tests.  If Pap tests have been discontinued, risk factors (such as a new sexual partner) need to be reassessed to determine if screening should be resumed.  Some medical problems can increase the chance of getting cervical cancer. In these  cases, your caregiver may recommend more frequent screening and Pap tests.  Uterine Cancer: If you have vaginal bleeding after reaching menopause, you should notify your caregiver.  Ovarian cancer: Other than yearly pelvic exams, there are no reliable tests available to screen for ovarian cancer at this time except for yearly pelvic exams.  Lung Cancer: Yearly chest X-rays can detect lung cancer and should be done on high risk women, such as cigarette smokers and women with chronic lung disease (emphysema).  Skin Cancer: A complete body skin exam should be done at your yearly examination. Avoid overexposure to the sun and ultraviolet light lamps. Use a strong sun block cream when in the sun. All of these things are important in lowering the risk of skin cancer. MENOPAUSE Menopause Symptoms: Hormone therapy products are effective for treating symptoms associated with menopause:  Moderate to severe hot flashes.  Night sweats.  Mood swings.  Headaches.  Tiredness.  Loss of sex drive.  Insomnia.  Other symptoms. Hormone replacement carries certain risks, especially in older women. Women who use or are thinking about using estrogen or estrogen with progestin treatments should discuss that with their caregiver. Your caregiver will help you understand the benefits and risks. The ideal dose of hormone replacement therapy is not known. The Food and Drug Administration (FDA) has concluded that hormone therapy should be used only at the lowest doses and for the shortest amount of time to reach treatment goals.  OSTEOPOROSIS Protecting Against Bone Loss and Preventing Fracture: If you use hormone therapy for prevention of bone loss (osteoporosis), the risks for bone loss must outweigh the risk of the therapy. Ask your caregiver about other medications known to be safe and effective for preventing bone loss and fractures. To guard against bone loss or fractures, the following is recommended:  If  you are less than age 50, take 1000 mg of calcium and at least 600 mg of Vitamin D per day.  If you are greater than age 50 but less than age 70, take 1200 mg of calcium and at least 600 mg of Vitamin D per day.  If you are greater than age 70, take 1200 mg of calcium and at least 800 mg of Vitamin D per day. Smoking and excessive alcohol intake increases the risk of osteoporosis. Eat foods rich in calcium and vitamin D and do weight bearing exercises several times a week as your caregiver suggests. DIABETES Diabetes Melitus: If you have Type I or Type 2 diabetes, you should keep your blood sugar under control with diet, exercise and recommended medication. Avoid too many sweets, starchy and fatty foods. Being overweight can make control more difficult. COGNITION AND MEMORY Cognition and Memory: Menopausal hormone therapy is not recommended for the prevention of cognitive disorders such as Alzheimer's disease or memory loss.  DEPRESSION  Depression may occur at any age, but is common in elderly women. The reasons may be because of physical, medical, social (loneliness), or financial problems and needs. If you are experiencing depression because of medical problems and control of symptoms, talk to your caregiver about this. Physical activity and   exercise may help with mood and sleep. Community and volunteer involvement may help your sense of value and worth. If you have depression and you feel that the problem is getting worse or becoming severe, talk to your caregiver about treatment options that are best for you. ACCIDENTS  Accidents are common and can be serious in the elderly woman. Prepare your house to prevent accidents. Eliminate throw rugs, place hand bars in the bath, shower and toilet areas. Avoid wearing high heeled shoes or walking on wet, snowy, and icy areas. Limit or stop driving if you have vision or hearing problems, or you feel you are unsteady with you movements and  reflexes. HEPATITIS C Hepatitis C is a type of viral infection affecting the liver. It is spread mainly through contact with blood from an infected person. It can be treated, but if left untreated, it can lead to severe liver damage over years. Many people who are infected do not know that the virus is in their blood. If you are a "baby-boomer", it is recommended that you have one screening test for Hepatitis C. IMMUNIZATIONS  Several immunizations are important to consider having during your senior years, including:   Tetanus, diptheria, and pertussis booster shot.  Influenza every year before the flu season begins.  Pneumonia vaccine.  Shingles vaccine.  Others as indicated based on your specific needs. Talk to your caregiver about these. Document Released: 08/16/2005 Document Revised: 06/10/2012 Document Reviewed: 04/11/2008 Exit[employer] Patient Information 2014 [employer].  

## 2013-06-24 ENCOUNTER — Other Ambulatory Visit: Payer: Self-pay | Admitting: Women's Health

## 2013-06-24 ENCOUNTER — Telehealth: Payer: Self-pay

## 2013-06-24 MED ORDER — FLUCONAZOLE 150 MG PO TABS: 150.0000 mg | ORAL_TABLET | Freq: Once | ORAL | Status: DC

## 2013-06-24 NOTE — Telephone Encounter (Signed)
Apologized to patient and let her know Rx at pharmacy.

## 2013-06-24 NOTE — Telephone Encounter (Signed)
Patient was in to see you yesterday. She said you told her you would send her in a prescription for Diflucan but it is not at pharmacy.

## 2013-06-24 NOTE — Telephone Encounter (Signed)
Please call and apologize for me, Rx sent now.

## 2013-08-16 ENCOUNTER — Other Ambulatory Visit: Payer: Self-pay

## 2013-08-16 DIAGNOSIS — Z1231 Encounter for screening mammogram for malignant neoplasm of breast: Secondary | ICD-10-CM

## 2013-08-16 DIAGNOSIS — Z803 Family history of malignant neoplasm of breast: Secondary | ICD-10-CM

## 2013-09-06 ENCOUNTER — Ambulatory Visit
Admission: RE | Admit: 2013-09-06 | Discharge: 2013-09-06 | Disposition: A | Discharge status: Home / Self Care | Payer: BC Managed Care – PPO | Source: Ambulatory Visit

## 2013-09-06 DIAGNOSIS — Z1231 Encounter for screening mammogram for malignant neoplasm of breast: Secondary | ICD-10-CM

## 2013-09-06 DIAGNOSIS — Z803 Family history of malignant neoplasm of breast: Secondary | ICD-10-CM

## 2013-09-07 ENCOUNTER — Ambulatory Visit (INDEPENDENT_AMBULATORY_CARE_PROVIDER_SITE_OTHER): Payer: BC Managed Care – PPO | Admitting: Women's Health

## 2013-09-07 DIAGNOSIS — R350 Frequency of micturition: Secondary | ICD-10-CM

## 2013-09-07 DIAGNOSIS — IMO0002 Reserved for concepts with insufficient information to code with codable children: Secondary | ICD-10-CM

## 2013-09-07 DIAGNOSIS — M549 Dorsalgia, unspecified: Secondary | ICD-10-CM

## 2013-09-07 LAB — WET PREP FOR TRICH, YEAST, CLUE
CLUE CELLS WET PREP: NONE SEEN
Trich, Wet Prep: NONE SEEN
Yeast Wet Prep HPF POC: NONE SEEN

## 2013-09-07 LAB — URINALYSIS W MICROSCOPIC + REFLEX CULTURE
Bilirubin Urine: NEGATIVE
GLUCOSE, UA: NEGATIVE mg/dL
HGB URINE DIPSTICK: NEGATIVE
KETONES UR: NEGATIVE mg/dL
Leukocytes, UA: NEGATIVE
Nitrite: NEGATIVE
Protein, ur: NEGATIVE mg/dL
Specific Gravity, Urine: 1.02 (ref 1.005–1.030)
Urobilinogen, UA: 0.2 mg/dL (ref 0.0–1.0)
pH: 6 (ref 5.0–8.0)

## 2013-09-07 NOTE — Progress Notes (Signed)
Patient ID: [name], female   DOB: [birth year], 58 y.o.   MRN: [id number] Presents with complaint of dyspareunia, vaginal burning with minimal itching for 3 days. Took one Diflucan tablet with minimal relief. Mild urinary frequency, denies pain at end of stream, abdominal pain or fever. Hysterectomy on Vivelle dot.  Exam: Appears well. UA negative. External genitalia erythematous at introitus, speculum exam scant discharge minimal erythema, no odor noted. Wet prep negative.  Vaginal irritation/burning  Plan: Reviewed normality of  UA and wet prep, reviewed importance of vaginal lubricants with intercourse. Instructed to call if continued discomfort.

## 2013-09-08 ENCOUNTER — Telehealth: Payer: Self-pay | Admitting: *Deleted

## 2013-09-08 ENCOUNTER — Other Ambulatory Visit: Payer: Self-pay | Admitting: Women's Health

## 2013-09-08 DIAGNOSIS — R928 Other abnormal and inconclusive findings on diagnostic imaging of breast: Secondary | ICD-10-CM

## 2013-09-08 MED ORDER — NYSTATIN-TRIAMCINOLONE 100000-0.1 UNIT/GM-% EX OINT: 1.0000 "application " | TOPICAL_OINTMENT | Freq: Two times a day (BID) | CUTANEOUS | Status: DC

## 2013-09-08 NOTE — Telephone Encounter (Signed)
Pt informed with the below note, rx sent. 

## 2013-09-08 NOTE — Telephone Encounter (Signed)
Please call in Mycolog, have her put a small amount at vaginal opening. Have her call if no relief.

## 2013-09-08 NOTE — Telephone Encounter (Signed)
Pt was seen yesterday c/o Vaginal irritation/burning normal u/a and wet prep, pt asked what can she use to put in the vaginal area for irratation? Please advise

## 2013-09-16 ENCOUNTER — Ambulatory Visit
Admission: RE | Admit: 2013-09-16 | Discharge: 2013-09-16 | Disposition: A | Discharge status: Home / Self Care | Payer: BC Managed Care – PPO | Source: Ambulatory Visit | Attending: Women's Health | Admitting: Women's Health

## 2013-09-16 DIAGNOSIS — R928 Other abnormal and inconclusive findings on diagnostic imaging of breast: Secondary | ICD-10-CM

## 2013-09-20 ENCOUNTER — Other Ambulatory Visit: Payer: BC Managed Care – PPO

## 2014-05-09 ENCOUNTER — Encounter: Payer: Self-pay | Admitting: Women's Health

## 2014-05-22 ENCOUNTER — Encounter: Payer: Self-pay | Admitting: *Deleted

## 2014-06-27 ENCOUNTER — Ambulatory Visit (INDEPENDENT_AMBULATORY_CARE_PROVIDER_SITE_OTHER): Payer: BC Managed Care – PPO | Admitting: Women's Health

## 2014-06-27 ENCOUNTER — Encounter: Payer: Self-pay | Admitting: Women's Health

## 2014-06-27 VITALS — BP 124/76 | Ht 61.75 in | Wt 205.0 lb

## 2014-06-27 DIAGNOSIS — Z79890 Hormone replacement therapy (postmenopausal): Secondary | ICD-10-CM

## 2014-06-27 DIAGNOSIS — Z01419 Encounter for gynecological examination (general) (routine) without abnormal findings: Secondary | ICD-10-CM

## 2014-06-27 DIAGNOSIS — N898 Other specified noninflammatory disorders of vagina: Secondary | ICD-10-CM

## 2014-06-27 LAB — WET PREP FOR TRICH, YEAST, CLUE
Clue Cells Wet Prep HPF POC: NONE SEEN
TRICH WET PREP: NONE SEEN
WBC WET PREP: NONE SEEN
YEAST WET PREP: NONE SEEN

## 2014-06-27 MED ORDER — ESTRADIOL 0.1 MG/24HR TD PTTW: 1.0000 | MEDICATED_PATCH | TRANSDERMAL | Status: DC

## 2014-06-27 NOTE — Progress Notes (Signed)
Evelina Dunina S Gerstel 01-Feb-1956 [id number]    History:    Presents for annual exam.  TAH with LSO for endometriosis on Vivelle 0.1 patch. 2005 bladder suspension. Normal Pap and mammogram history. 09/2013 mammogram normal after ultrasound. 2011 DEXA T score -2.6, fractured ankle 2014 with traumatic fall. Has not had a colonoscopy. Hypertension/hypothyroidism/depression/IC managed by primary care.  Past medical history, past surgical history, family history and social history were all reviewed and documented in the EPIC chart. Retired this past year from [employer]. Helping daughter Selena BattenKim care for 5129-month-old baby Dorie RankJayce. Engaged, process of renovating a home.  ROS:  A ROS was performed and pertinent positives and negatives are included.  Exam:  Filed Vitals:   06/27/14 1209  BP: 124/76    General appearance:  Normal Thyroid:  Symmetrical, normal in size, without palpable masses or nodularity. Respiratory  Auscultation:  Clear without wheezing or rhonchi Cardiovascular  Auscultation:  Regular rate, without rubs, murmurs or gallops  Edema/varicosities:  Not grossly evident Abdominal  Soft,nontender, without masses, guarding or rebound.  Liver/spleen:  No organomegaly noted  Hernia:  None appreciated  Skin  Inspection:  Grossly normal   Breasts: Examined lying and sitting.     Right: Without masses, retractions, discharge or axillary adenopathy.     Left: Without masses, retractions, discharge or axillary adenopathy. Gentitourinary   Inguinal/mons:  Normal without inguinal adenopathy  External genitalia:  Normal  BUS/Urethra/Skene's glands:  Normal  Vagina:  Normal wet prep negative  Cervix: Absent  Uterus: Absent  Adnexa/parametria:     Rt: Without masses or tenderness.   Lt: Without masses or tenderness.  Anus and perineum: Normal  Digital rectal exam: Normal sphincter tone without palpated masses or tenderness  Assessment/Plan:  58 y.o. SWF G1P1 for annual exam with complaint of  vaginal irritation.  TAH with LSO for endometriosis on HRT Hypertension/hypothyroidism/depression/IC-primary care manages labs and meds  Plan: Repeat DEXA, will schedule. Home safety, fall prevention and importance of regular exercise reviewed. Reviewed importance of screening colonoscopy, [facility] information given and reviewed instructed to schedule. Vivelle 0.1 patch prescription, proper use given and reviewed risks of blood clots, strokes, breast cancer. States has numerous hot flashes if not on would like to continue. UA. A and D ointment to external genitalia for irritation.  Return to office if persists.    Harrington ChallengerYOUNG,NANCY J WHNP, 1:00 PM 06/27/2014

## 2014-06-27 NOTE — Patient Instructions (Signed)
Health Recommendations for Postmenopausal Women Respected and ongoing research has looked at the most common causes of death, disability, and poor quality of life in postmenopausal women. The causes include heart disease, diseases of blood vessels, diabetes, depression, cancer, and bone loss (osteoporosis). Many things can be done to help lower the chances of developing these and other common problems. CARDIOVASCULAR DISEASE Heart Disease: A heart attack is a medical emergency. Know the signs and symptoms of a heart attack. Below are things women can do to reduce their risk for heart disease.   Do not smoke. If you smoke, quit.  Aim for a healthy weight. Being overweight causes many preventable deaths. Eat a healthy and balanced diet and drink an adequate amount of liquids.  Get moving. Make a commitment to be more physically active. Aim for 30 minutes of activity on most, if not all days of the week.  Eat for heart health. Choose a diet that is low in saturated fat and cholesterol and eliminate trans fat. Include whole grains, vegetables, and fruits. Read and understand the labels on food containers before buying.  Know your numbers. Ask your caregiver to check your blood pressure, cholesterol (total, HDL, LDL, triglycerides) and blood glucose. Work with your caregiver on improving your entire clinical picture.  High blood pressure. Limit or stop your table salt intake (try salt substitute and food seasonings). Avoid salty foods and drinks. Read labels on food containers before buying. Eating well and exercising can help control high blood pressure. STROKE  Stroke is a medical emergency. Stroke may be the result of a blood clot in a blood vessel in the brain or by a brain hemorrhage (bleeding). Know the signs and symptoms of a stroke. To lower the risk of developing a stroke:  Avoid fatty foods.  Quit smoking.  Control your diabetes, blood pressure, and irregular heart rate. THROMBOPHLEBITIS  (BLOOD CLOT) OF THE LEG  Becoming overweight and leading a stationary lifestyle may also contribute to developing blood clots. Controlling your diet and exercising will help lower the risk of developing blood clots. CANCER SCREENING  Breast Cancer: Take steps to reduce your risk of breast cancer.  You should practice "breast self-awareness." This means understanding the normal appearance and feel of your breasts and should include breast self-examination. Any changes detected, no matter how small, should be reported to your caregiver.  After age 40, you should have a clinical breast exam (CBE) every year.  Starting at age 40, you should consider having a mammogram (breast X-ray) every year.  If you have a family history of breast cancer, talk to your caregiver about genetic screening.  If you are at high risk for breast cancer, talk to your caregiver about having an MRI and a mammogram every year.  Intestinal or Stomach Cancer: Tests to consider are a rectal exam, fecal occult blood, sigmoidoscopy, and colonoscopy. Women who are high risk may need to be screened at an earlier age and more often.  Cervical Cancer:  Beginning at age 30, you should have a Pap test every 3 years as long as the past 3 Pap tests have been normal.  If you have had past treatment for cervical cancer or a condition that could lead to cancer, you need Pap tests and screening for cancer for at least 20 years after your treatment.  If you had a hysterectomy for a problem that was not cancer or a condition that could lead to cancer, then you no longer need Pap tests.    If you are between ages 65 and 70, and you have had normal Pap tests going back 10 years, you no longer need Pap tests.  If Pap tests have been discontinued, risk factors (such as a new sexual partner) need to be reassessed to determine if screening should be resumed.  Some medical problems can increase the chance of getting cervical cancer. In these  cases, your caregiver may recommend more frequent screening and Pap tests.  Uterine Cancer: If you have vaginal bleeding after reaching menopause, you should notify your caregiver.  Ovarian Cancer: Other than yearly pelvic exams, there are no reliable tests available to screen for ovarian cancer at this time except for yearly pelvic exams.  Lung Cancer: Yearly chest X-rays can detect lung cancer and should be done on high risk women, such as cigarette smokers and women with chronic lung disease (emphysema).  Skin Cancer: A complete body skin exam should be done at your yearly examination. Avoid overexposure to the sun and ultraviolet light lamps. Use a strong sun block cream when in the sun. All of these things are important for lowering the risk of skin cancer. MENOPAUSE Menopause Symptoms: Hormone therapy products are effective for treating symptoms associated with menopause:  Moderate to severe hot flashes.  Night sweats.  Mood swings.  Headaches.  Tiredness.  Loss of sex drive.  Insomnia.  Other symptoms. Hormone replacement carries certain risks, especially in older women. Women who use or are thinking about using estrogen or estrogen with progestin treatments should discuss that with their caregiver. Your caregiver will help you understand the benefits and risks. The ideal dose of hormone replacement therapy is not known. The Food and Drug Administration (FDA) has concluded that hormone therapy should be used only at the lowest doses and for the shortest amount of time to reach treatment goals.  OSTEOPOROSIS Protecting Against Bone Loss and Preventing Fracture If you use hormone therapy for prevention of bone loss (osteoporosis), the risks for bone loss must outweigh the risk of the therapy. Ask your caregiver about other medications known to be safe and effective for preventing bone loss and fractures. To guard against bone loss or fractures, the following is recommended:  If  you are younger than age 50, take 1000 mg of calcium and at least 600 mg of Vitamin D per day.  If you are older than age 50 but younger than age 70, take 1200 mg of calcium and at least 600 mg of Vitamin D per day.  If you are older than age 70, take 1200 mg of calcium and at least 800 mg of Vitamin D per day. Smoking and excessive alcohol intake increases the risk of osteoporosis. Eat foods rich in calcium and vitamin D and do weight bearing exercises several times a week as your caregiver suggests. DIABETES Diabetes Mellitus: If you have type I or type 2 diabetes, you should keep your blood sugar under control with diet, exercise, and recommended medication. Avoid starchy and fatty foods, and too many sweets. Being overweight can make diabetes control more difficult. COGNITION AND MEMORY Cognition and Memory: Menopausal hormone therapy is not recommended for the prevention of cognitive disorders such as Alzheimer's disease or memory loss.  DEPRESSION  Depression may occur at any age, but it is common in elderly women. This may be because of physical, medical, social (loneliness), or financial problems and needs. If you are experiencing depression because of medical problems and control of symptoms, talk to your caregiver about this. Physical   activity and exercise may help with mood and sleep. Community and volunteer involvement may improve your sense of value and worth. If you have depression and you feel that the problem is getting worse or becoming severe, talk to your caregiver about which treatment options are best for you. ACCIDENTS  Accidents are common and can be serious in elderly woman. Prepare your house to prevent accidents. Eliminate throw rugs, place hand bars in bath, shower, and toilet areas. Avoid wearing high heeled shoes or walking on wet, snowy, and icy areas. Limit or stop driving if you have vision or hearing problems, or if you feel you are unsteady with your movements and  reflexes. HEPATITIS C Hepatitis C is a type of viral infection affecting the liver. It is spread mainly through contact with blood from an infected person. It can be treated, but if left untreated, it can lead to severe liver damage over the years. Many people who are infected do not know that the virus is in their blood. If you are a "baby-boomer", it is recommended that you have one screening test for Hepatitis C. IMMUNIZATIONS  Several immunizations are important to consider having during your senior years, including:   Tetanus, diphtheria, and pertussis booster shot.  Influenza every year before the flu season begins.  Pneumonia vaccine.  Shingles vaccine.  Others, as indicated based on your specific needs. Talk to your caregiver about these. Document Released: 08/16/2005 Document Revised: 11/08/2013 Document Reviewed: 04/11/2008 Exit[employer] Patient Information 2015 [employer]. This information is not intended to replace advice given to you by your health care provider. Make sure you discuss any questions you have with your health care provider.  

## 2014-09-15 ENCOUNTER — Other Ambulatory Visit: Payer: Self-pay

## 2014-09-15 DIAGNOSIS — Z1231 Encounter for screening mammogram for malignant neoplasm of breast: Secondary | ICD-10-CM

## 2014-09-15 DIAGNOSIS — Z808 Family history of malignant neoplasm of other organs or systems: Secondary | ICD-10-CM

## 2014-09-15 DIAGNOSIS — Z809 Family history of malignant neoplasm, unspecified: Secondary | ICD-10-CM

## 2014-10-05 ENCOUNTER — Ambulatory Visit
Admission: RE | Admit: 2014-10-05 | Discharge: 2014-10-05 | Disposition: A | Discharge status: Home / Self Care | Payer: BLUE CROSS/BLUE SHIELD | Source: Ambulatory Visit

## 2014-10-05 DIAGNOSIS — Z1231 Encounter for screening mammogram for malignant neoplasm of breast: Secondary | ICD-10-CM

## 2014-10-05 DIAGNOSIS — Z808 Family history of malignant neoplasm of other organs or systems: Secondary | ICD-10-CM

## 2014-10-05 DIAGNOSIS — Z809 Family history of malignant neoplasm, unspecified: Secondary | ICD-10-CM

## 2015-01-13 ENCOUNTER — Telehealth: Payer: Self-pay | Admitting: *Deleted

## 2015-01-13 NOTE — Telephone Encounter (Signed)
Prior authorization for minivelle patch 0.1 mg [facility], will wait for response.

## 2015-01-17 NOTE — Telephone Encounter (Signed)
minivelle patch was denied by [facility] pt will need to have tried and failed other alternatives such as climara patch, estrace tablets or premarin. Please advise

## 2015-01-19 NOTE — Telephone Encounter (Signed)
I tried to call pt to relay the below, but unable to leave message as her voicemail is full.

## 2015-01-19 NOTE — Telephone Encounter (Signed)
I think she has tried the generic  Estradiol patch but if not estradiol .05 patch weekly

## 2015-07-05 ENCOUNTER — Encounter: Payer: Self-pay | Admitting: Women's Health

## 2015-07-07 ENCOUNTER — Other Ambulatory Visit: Payer: Self-pay

## 2015-07-07 DIAGNOSIS — Z79890 Hormone replacement therapy (postmenopausal): Secondary | ICD-10-CM

## 2015-07-07 MED ORDER — ESTRADIOL 0.1 MG/24HR TD PTTW: 1.0000 | MEDICATED_PATCH | TRANSDERMAL | Status: DC

## 2015-07-26 ENCOUNTER — Ambulatory Visit (INDEPENDENT_AMBULATORY_CARE_PROVIDER_SITE_OTHER): Payer: BLUE CROSS/BLUE SHIELD | Admitting: Women's Health

## 2015-07-26 ENCOUNTER — Encounter: Payer: Self-pay | Admitting: Women's Health

## 2015-07-26 VITALS — BP 126/80 | Ht 61.0 in | Wt 216.0 lb

## 2015-07-26 DIAGNOSIS — Z01419 Encounter for gynecological examination (general) (routine) without abnormal findings: Secondary | ICD-10-CM | POA: Diagnosis not present

## 2015-07-26 DIAGNOSIS — G4700 Insomnia, unspecified: Secondary | ICD-10-CM

## 2015-07-26 DIAGNOSIS — Z79890 Hormone replacement therapy (postmenopausal): Secondary | ICD-10-CM

## 2015-07-26 DIAGNOSIS — Z13820 Encounter for screening for osteoporosis: Secondary | ICD-10-CM

## 2015-07-26 MED ORDER — ZOLPIDEM TARTRATE 10 MG PO TABS: 10.0000 mg | ORAL_TABLET | Freq: Every evening | ORAL | Status: DC | PRN

## 2015-07-26 MED ORDER — ESTRADIOL 0.1 MG/24HR TD PTTW: 1.0000 | MEDICATED_PATCH | TRANSDERMAL | Status: DC

## 2015-07-26 NOTE — Patient Instructions (Signed)
[facility]  Sleeve Gastrectomy A sleeve gastrectomy is a surgery in which a large portion of the stomach is removed. After the surgery, the stomach will be a narrow tube about the size of a banana. This surgery is performed to help a person lose weight. The person loses weight because the reduced size of the stomach restricts the amount of food that the person can eat. The stomach will hold much less food than before the surgery. Also, the part of the stomach that is removed produces a hormone that causes hunger.  This surgery is done for people who have morbid obesity, defined as a body mass index (BMI) greater than 40. BMI is an estimate of body fat and is calculated from the height and weight of a person. This surgery may also be done for people with a BMI between 35 and 40 if they have other diseases, such as type 2 diabetes mellitus, obstructive sleep apnea, or heart and lung disorders (cardiopulmonary diseases).  LET [facility] CARE PROVIDER KNOW ABOUT:  Any allergies you have.   All medicines you are taking, including vitamins, herbs, eyedrops, creams, and over-the-counter medicines.   Use of steroids (by mouth or creams).   Previous problems you or members of your family have had with the use of anesthetics.   Any blood disorders you have.   Previous surgeries you have had.   Possibility of pregnancy, if this applies.   Other health problems you have. RISKS AND COMPLICATIONS Generally, sleeve gastrectomy is a safe procedure. However, as with any procedure, complications can occur. Possible complications include:  Infection.  Bleeding.  Blood clots.  Damage to other organs or tissue.  Leakage of fluid from the stomach into the abdominal cavity (rare). BEFORE THE PROCEDURE  You may need to have blood tests and imaging tests (such as X-rays or ultrasonography) done before the day of surgery. A test to evaluate your esophagus and how it moves (esophageal  manometry) may also be done.  You may be placed on a liquid diet 2-3 weeks before the surgery.  Ask your health care provider about changing or stopping your regular medicines.  Do not eat or drink anything for at least 8 hours before the procedure.   Make plans to have someone drive you home after your hospital stay. Also arrange for someone to help you with activities during recovery. PROCEDURE  A laparoscopic technique is usually used for this surgery:  You will be given medicine to make you sleep through the procedure (general anesthetic). This medicine will be given through an intravenous (IV) access tube that is put into one of your veins.  Once you are asleep, your abdomen will be cleaned and sterilized.  Several small incisions will be made in your abdomen.  Your abdomen will be filled with air so that it expands. This gives the surgeon more room to operate and makes your organs easier to see.  A thin, lighted tube with a tiny camera on the end (laparoscope) is put through a small incision in your abdomen. The camera on the laparoscope sends a picture to a TV screen in the operating room. This gives the surgeon a good view inside the abdomen.  Hollow tubes are put through the other small incisions in your abdomen. The tools needed for the procedure are put through these tubes.  The surgeon uses staples to divide part of the stomach and then removes it through one of the incisions.  The remaining stomach may  be reinforced using stitches or surgical glue or both to prevent leakage of the stomach contents. A small tube (drain) may be placed through one of the incisions to allow extra fluid to flow from the area.  The incisions are closed with stitches, staples, or glue. AFTER THE PROCEDURE  You will be monitored closely in a recovery area. Once the anesthetic has worn off, you will likely be moved to a regular hospital room.  You will be given medicine for pain and nausea.    You may have a drain from one of the incisions in your abdomen. If a drain is used, it may stay in place after you go home from the hospital and be removed at a follow-up appointment.   You will be encouraged to walk around several times a day. This helps prevent blood clots.  You will be started on a liquid diet the first day after your surgery. Sometimes a test is done to check for leaking before you can eat.  You will be urged to cough and do deep breathing exercises. This helps prevent a lung infection after a surgery.  You will likely need to stay in the hospital for a few days.    This information is not intended to replace advice given to you by your health care provider. Make sure you discuss any questions you have with your health care provider.   Document Released: 04/21/2009 Document Revised: 02/24/2013 Document Reviewed: 12/16/2014 Elsevier Interactive Patient Education [employer]. Menopause is a normal process in which your reproductive ability comes to an end. This process happens gradually over a span of months to years, usually between the ages of 4 and 2. Menopause is complete when you have missed 12 consecutive menstrual periods. It is important to talk with your health care provider about some of the most common conditions that affect postmenopausal women, such as heart disease, cancer, and bone loss (osteoporosis). Adopting a healthy lifestyle and getting preventive care can help to promote your health and wellness. Those actions can also lower your chances of developing some of these common conditions. WHAT SHOULD I KNOW ABOUT MENOPAUSE? During menopause, you may experience a number of symptoms, such as:  Moderate-to-severe hot flashes.  Night sweats.  Decrease in sex drive.  Mood swings.  Headaches.  Tiredness.  Irritability.  Memory problems.  Insomnia. Choosing to treat or not to treat menopausal changes is an individual decision that you  make with your health care provider. WHAT SHOULD I KNOW ABOUT HORMONE REPLACEMENT THERAPY AND SUPPLEMENTS? Hormone therapy products are effective for treating symptoms that are associated with menopause, such as hot flashes and night sweats. Hormone replacement carries certain risks, especially as you become older. If you are thinking about using estrogen or estrogen with progestin treatments, discuss the benefits and risks with your health care provider. WHAT SHOULD I KNOW ABOUT HEART DISEASE AND STROKE? Heart disease, heart attack, and stroke become more likely as you age. This may be due, in part, to the hormonal changes that your body experiences during menopause. These can affect how your body processes dietary fats, triglycerides, and cholesterol. Heart attack and stroke are both medical emergencies. There are many things that you can do to help prevent heart disease and stroke:  Have your blood pressure checked at least every 1-2 years. High blood pressure causes heart disease and increases the risk of stroke.  If you are 22-30 years old, ask your health care provider if you should take aspirin  to prevent a heart attack or a stroke.  Do not use any tobacco products, including cigarettes, chewing tobacco, or electronic cigarettes. If you need help quitting, ask your health care provider.  It is important to eat a healthy diet and maintain a healthy weight.  Be sure to include plenty of vegetables, fruits, low-fat dairy products, and lean protein.  Avoid eating foods that are high in solid fats, added sugars, or salt (sodium).  Get regular exercise. This is one of the most important things that you can do for your health.  Try to exercise for at least 150 minutes each week. The type of exercise that you do should increase your heart rate and make you sweat. This is known as moderate-intensity exercise.  Try to do strengthening exercises at least twice each week. Do these in addition to  the moderate-intensity exercise.  Know your numbers.Ask your health care provider to check your cholesterol and your blood glucose. Continue to have your blood tested as directed by your health care provider. WHAT SHOULD I KNOW ABOUT CANCER SCREENING? There are several types of cancer. Take the following steps to reduce your risk and to catch any cancer development as early as possible. Breast Cancer  Practice breast self-awareness.  This means understanding how your breasts normally appear and feel.  It also means doing regular breast self-exams. Let your health care provider know about any changes, no matter how small.  If you are 73 or older, have a clinician do a breast exam (clinical breast exam or CBE) every year. Depending on your age, family history, and medical history, it may be recommended that you also have a yearly breast X-ray (mammogram).  If you have a family history of breast cancer, talk with your health care provider about genetic screening.  If you are at high risk for breast cancer, talk with your health care provider about having an MRI and a mammogram every year.  Breast cancer (BRCA) gene test is recommended for women who have family members with BRCA-related cancers. Results of the assessment will determine the need for genetic counseling and BRCA1 and for BRCA2 testing. BRCA-related cancers include these types:  Breast. This occurs in males or females.  Ovarian.  Tubal. This may also be called fallopian tube cancer.  Cancer of the abdominal or pelvic lining (peritoneal cancer).  Prostate.  Pancreatic. Cervical, Uterine, and Ovarian Cancer Your health care provider may recommend that you be screened regularly for cancer of the pelvic organs. These include your ovaries, uterus, and vagina. This screening involves a pelvic exam, which includes checking for microscopic changes to the surface of your cervix (Pap test).  For women ages 21-65, health care  providers may recommend a pelvic exam and a Pap test every three years. For women ages 27-65, they may recommend the Pap test and pelvic exam, combined with testing for human papilloma virus (HPV), every five years. Some types of HPV increase your risk of cervical cancer. Testing for HPV may also be done on women of any age who have unclear Pap test results.  Other health care providers may not recommend any screening for nonpregnant women who are considered low risk for pelvic cancer and have no symptoms. Ask your health care provider if a screening pelvic exam is right for you.  If you have had past treatment for cervical cancer or a condition that could lead to cancer, you need Pap tests and screening for cancer for at least 20 years after your  treatment. If Pap tests have been discontinued for you, your risk factors (such as having a new sexual partner) need to be reassessed to determine if you should start having screenings again. Some women have medical problems that increase the chance of getting cervical cancer. In these cases, your health care provider may recommend that you have screening and Pap tests more often.  If you have a family history of uterine cancer or ovarian cancer, talk with your health care provider about genetic screening.  If you have vaginal bleeding after reaching menopause, tell your health care provider.  There are currently no reliable tests available to screen for ovarian cancer. Lung Cancer Lung cancer screening is recommended for adults 81-30 years old who are at high risk for lung cancer because of a history of smoking. A yearly low-dose CT scan of the lungs is recommended if you:  Currently smoke.  Have a history of at least 30 pack-years of smoking and you currently smoke or have quit within the past 15 years. A pack-year is smoking an average of one pack of cigarettes per day for one year. Yearly screening should:  Continue until it has been 15 years since  you quit.  Stop if you develop a health problem that would prevent you from having lung cancer treatment. Colorectal Cancer  This type of cancer can be detected and can often be prevented.  Routine colorectal cancer screening usually begins at age 32 and continues through age 102.  If you have risk factors for colon cancer, your health care provider may recommend that you be screened at an earlier age.  If you have a family history of colorectal cancer, talk with your health care provider about genetic screening.  Your health care provider may also recommend using home test kits to check for hidden blood in your stool.  A small camera at the end of a tube can be used to examine your colon directly (sigmoidoscopy or colonoscopy). This is done to check for the earliest forms of colorectal cancer.  Direct examination of the colon should be repeated every 5-10 years until age 54. However, if early forms of precancerous polyps or small growths are found or if you have a family history or genetic risk for colorectal cancer, you may need to be screened more often. Skin Cancer  Check your skin from head to toe regularly.  Monitor any moles. Be sure to tell your health care provider:  About any new moles or changes in moles, especially if there is a change in a mole's shape or color.  If you have a mole that is larger than the size of a pencil eraser.  If any of your family members has a history of skin cancer, especially at a young age, talk with your health care provider about genetic screening.  Always use sunscreen. Apply sunscreen liberally and repeatedly throughout the day.  Whenever you are outside, protect yourself by wearing long sleeves, pants, a wide-brimmed hat, and sunglasses. WHAT SHOULD I KNOW ABOUT OSTEOPOROSIS? Osteoporosis is a condition in which bone destruction happens more quickly than new bone creation. After menopause, you may be at an increased risk for osteoporosis.  To help prevent osteoporosis or the bone fractures that can happen because of osteoporosis, the following is recommended:  If you are 75-77 years old, get at least 1,000 mg of calcium and at least 600 mg of vitamin D per day.  If you are older than age 23 but younger than age  70, get at least 1,200 mg of calcium and at least 600 mg of vitamin D per day.  If you are older than age 20, get at least 1,200 mg of calcium and at least 800 mg of vitamin D per day. Smoking and excessive alcohol intake increase the risk of osteoporosis. Eat foods that are rich in calcium and vitamin D, and do weight-bearing exercises several times each week as directed by your health care provider. WHAT SHOULD I KNOW ABOUT HOW MENOPAUSE AFFECTS [place]? Depression may occur at any age, but it is more common as you become older. Common symptoms of depression include:  Low or sad mood.  Changes in sleep patterns.  Changes in appetite or eating patterns.  Feeling an overall lack of motivation or enjoyment of activities that you previously enjoyed.  Frequent crying spells. Talk with your health care provider if you think that you are experiencing depression. WHAT SHOULD I KNOW ABOUT IMMUNIZATIONS? It is important that you get and maintain your immunizations. These include:  Tetanus, diphtheria, and pertussis (Tdap) booster vaccine.  Influenza every year before the flu season begins.  Pneumonia vaccine.  Shingles vaccine. Your health care provider may also recommend other immunizations.   This information is not intended to replace advice given to you by your health care provider. Make sure you discuss any questions you have with your health care provider.   Document Released: 08/16/2005 Document Revised: 07/15/2014 Document Reviewed: 02/24/2014 Elsevier Interactive Patient Education [employer].

## 2015-07-26 NOTE — Progress Notes (Signed)
[name] 06-22-1956 [id number]    History:    Presents for annual exam.  TAH with LSO on Vivelle Dot with occasional hot flashes. 2005 bladder suspension. Normal Pap and mammogram history. 09/2013 normal mammogram after ultrasound. 2011 T score 2, history of a fractured ankle after a fall. Has not had a colonoscopy. Hypertension/hypercholesterolemia and IC managed by primary care.  Past medical history, past surgical history, family history and social history were all reviewed and documented in the EPIC chart. Retired, helping take care of daughters 8-month-old daughter, overwhelmed at times and daughter is pregnant with second child due in March. Renovating at home. Engaged no date set for marriage.  ROS:  A ROS was performed and pertinent positives and negatives are included.  Exam:  Filed Vitals:   07/26/15 1413  BP: 126/80    General appearance:  Normal Thyroid:  Symmetrical, normal in size, without palpable masses or nodularity. Respiratory  Auscultation:  Clear without wheezing or rhonchi Cardiovascular  Auscultation:  Regular rate, without rubs, murmurs or gallops  Edema/varicosities:  Not grossly evident Abdominal  Soft,nontender, without masses, guarding or rebound.  Liver/spleen:  No organomegaly noted  Hernia:  None appreciated  Skin  Inspection:  Grossly normal   Breasts: Examined lying and sitting. Pendulous    Right: Without masses, retractions, discharge or axillary adenopathy.     Left: Without masses, retractions, discharge or axillary adenopathy. Gentitourinary   Inguinal/mons:  Normal without inguinal adenopathy  External genitalia:  Normal  BUS/Urethra/Skene's glands:  Normal  Vagina:  Normal  Cervix:  Absent  Uterus:  Absent  Adnexa/parametria:     Rt: Without masses or tenderness.   Lt: Without masses or tenderness.  Anus and perineum: Normal  Digital rectal exam: Normal sphincter tone without palpated masses or tenderness  Assessment/Plan:  60  y.o. S WF G1 P1 for annual exam complaint of left leg/knee pain.  Lower back/knee pain-orthopedist TAH with LSO on Vivelle Dot Obesity Hypertension/hypercholesterolemia/IC/depression-primary care manages labs and meds  Plan: HRT reviewed risks of blood clots, strokes and breast cancer, states continues to have hot flashes will continue Vivelle Dot 0.1 prescription, proper use given and reviewed. Women's health initiative reviewed. Instructed to schedule DEXA, reviewed importance of fall prevention, home safety and  increasing regular weightbearing exercise..  Decrease calories, increase exercise for weight loss encouraged. Is contemplating weight loss surgery. Reviewed importance of screening colonoscopy, instructed to call [facility] and schedule. Continue to work on increasing leisure activities.    [facility], 5:38 PM 07/26/2015

## 2015-07-27 LAB — URINALYSIS W MICROSCOPIC + REFLEX CULTURE
Bacteria, UA: NONE SEEN [HPF]
Bilirubin Urine: NEGATIVE
CASTS: NONE SEEN [LPF]
CRYSTALS: NONE SEEN [HPF]
Glucose, UA: NEGATIVE
HGB URINE DIPSTICK: NEGATIVE
Ketones, ur: NEGATIVE
Leukocytes, UA: NEGATIVE
Nitrite: NEGATIVE
PH: 5.5 (ref 5.0–8.0)
Protein, ur: NEGATIVE
RBC / HPF: NONE SEEN RBC/HPF (ref <=2)
SPECIFIC GRAVITY, URINE: 1.022 (ref 1.001–1.035)
Squamous Epithelial / LPF: NONE SEEN [HPF] (ref <=5)
WBC, UA: NONE SEEN WBC/HPF (ref <=5)
YEAST: NONE SEEN [HPF]

## 2015-08-18 ENCOUNTER — Other Ambulatory Visit: Payer: Self-pay | Admitting: *Deleted

## 2015-08-18 DIAGNOSIS — Z79890 Hormone replacement therapy (postmenopausal): Secondary | ICD-10-CM

## 2015-08-18 MED ORDER — ESTRADIOL 0.1 MG/24HR TD PTTW: 1.0000 | MEDICATED_PATCH | TRANSDERMAL | Status: DC

## 2015-08-21 ENCOUNTER — Other Ambulatory Visit: Payer: Self-pay

## 2015-08-21 DIAGNOSIS — Z79890 Hormone replacement therapy (postmenopausal): Secondary | ICD-10-CM

## 2015-08-21 MED ORDER — ESTRADIOL 0.1 MG/24HR TD PTTW: 1.0000 | MEDICATED_PATCH | TRANSDERMAL | Status: DC

## 2015-09-23 ENCOUNTER — Encounter: Payer: Self-pay | Admitting: Emergency Medicine

## 2015-09-23 ENCOUNTER — Emergency Department: Payer: BLUE CROSS/BLUE SHIELD

## 2015-09-23 DIAGNOSIS — Z791 Long term (current) use of non-steroidal anti-inflammatories (NSAID): Secondary | ICD-10-CM | POA: Insufficient documentation

## 2015-09-23 DIAGNOSIS — S161XXA Strain of muscle, fascia and tendon at neck level, initial encounter: Secondary | ICD-10-CM | POA: Insufficient documentation

## 2015-09-23 DIAGNOSIS — Y998 Other external cause status: Secondary | ICD-10-CM | POA: Insufficient documentation

## 2015-09-23 DIAGNOSIS — Y92410 Unspecified street and highway as the place of occurrence of the external cause: Secondary | ICD-10-CM | POA: Insufficient documentation

## 2015-09-23 DIAGNOSIS — Z880 Allergy status to penicillin: Secondary | ICD-10-CM | POA: Diagnosis not present

## 2015-09-23 DIAGNOSIS — I10 Essential (primary) hypertension: Secondary | ICD-10-CM | POA: Insufficient documentation

## 2015-09-23 DIAGNOSIS — Z79899 Other long term (current) drug therapy: Secondary | ICD-10-CM | POA: Insufficient documentation

## 2015-09-23 DIAGNOSIS — Y9389 Activity, other specified: Secondary | ICD-10-CM | POA: Diagnosis not present

## 2015-09-23 DIAGNOSIS — S20212A Contusion of left front wall of thorax, initial encounter: Secondary | ICD-10-CM | POA: Insufficient documentation

## 2015-09-23 DIAGNOSIS — S299XXA Unspecified injury of thorax, initial encounter: Secondary | ICD-10-CM | POA: Diagnosis present
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
## 2015-09-24 ENCOUNTER — Emergency Department
Admission: EM | Admit: 2015-09-24 | Discharge: 2015-09-24 | Disposition: A | Discharge status: Home / Self Care | Payer: BLUE CROSS/BLUE SHIELD | Attending: Emergency Medicine | Admitting: Emergency Medicine

## 2015-09-24 ENCOUNTER — Emergency Department: Payer: BLUE CROSS/BLUE SHIELD

## 2015-09-24 DIAGNOSIS — S161XXA Strain of muscle, fascia and tendon at neck level, initial encounter: Secondary | ICD-10-CM

## 2015-09-24 DIAGNOSIS — S20212A Contusion of left front wall of thorax, initial encounter: Secondary | ICD-10-CM

## 2015-09-24 MED ORDER — IBUPROFEN 600 MG PO TABS
600.0000 mg | ORAL_TABLET | Freq: Once | ORAL | Status: AC
  Administered 2015-09-24: 600 mg via ORAL
  Filled 2015-09-24: qty 1

## 2015-09-24 MED ORDER — ACETAMINOPHEN 325 MG PO TABS
650.0000 mg | ORAL_TABLET | Freq: Once | ORAL | Status: AC
  Administered 2015-09-24: 650 mg via ORAL
  Filled 2015-09-24: qty 2

## 2015-09-24 NOTE — Discharge Instructions (Signed)
Cervical Sprain °A cervical sprain is an injury in the neck in which the strong, fibrous tissues (ligaments) that connect your neck bones stretch or tear. Cervical sprains can range from mild to severe. Severe cervical sprains can cause the neck vertebrae to be unstable. This can lead to damage of the spinal cord and can result in serious nervous system problems. The amount of time it takes for a cervical sprain to get better depends on the cause and extent of the injury. Most cervical sprains heal in 1 to 3 weeks. °CAUSES  °Severe cervical sprains may be caused by:  °· Contact sport injuries (such as from football, rugby, wrestling, hockey, auto racing, gymnastics, diving, martial arts, or boxing).   °· Motor vehicle collisions.   °· Whiplash injuries. This is an injury from a sudden forward and backward whipping movement of the head and neck.  °· Falls.   °Mild cervical sprains may be caused by:  °· Being in an awkward position, such as while cradling a telephone between your ear and shoulder.   °· Sitting in a chair that does not offer proper support.   °· Working at a poorly designed computer station.   °· Looking up or down for long periods of time.   °SYMPTOMS  °· Pain, soreness, stiffness, or a burning sensation in the front, back, or sides of the neck. This discomfort may develop immediately after the injury or slowly, 24 hours or more after the injury.   °· Pain or tenderness directly in the middle of the back of the neck.   °· Shoulder or upper back pain.   °· Limited ability to move the neck.   °· Headache.   °· Dizziness.   °· Weakness, numbness, or tingling in the hands or arms.   °· Muscle spasms.   °· Difficulty swallowing or chewing.   °· Tenderness and swelling of the neck.   °DIAGNOSIS  °Most of the time your health care provider can diagnose a cervical sprain by taking your history and doing a physical exam. Your health care provider will ask about previous neck injuries and any known neck  problems, such as arthritis in the neck. X-rays may be taken to find out if there are any other problems, such as with the bones of the neck. Other tests, such as a CT scan or MRI, may also be needed.  °TREATMENT  °Treatment depends on the severity of the cervical sprain. Mild sprains can be treated with rest, keeping the neck in place (immobilization), and pain medicines. Severe cervical sprains are immediately immobilized. Further treatment is done to help with pain, muscle spasms, and other symptoms and may include: °· Medicines, such as pain relievers, numbing medicines, or muscle relaxants.   °· Physical therapy. This may involve stretching exercises, strengthening exercises, and posture training. Exercises and improved posture can help stabilize the neck, strengthen muscles, and help stop symptoms from returning.   °HOME CARE INSTRUCTIONS  °· Put ice on the injured area.   °¨ Put ice in a plastic bag.   °¨ Place a towel between your skin and the bag.   °¨ Leave the ice on for 15-20 minutes, 3-4 times a day.   °· If your injury was severe, you may have been given a cervical collar to wear. A cervical collar is a two-piece collar designed to keep your neck from moving while it heals. °¨ Do not remove the collar unless instructed by your health care provider. °¨ If you have long hair, keep it outside of the collar. °¨ Ask your health care provider before making any adjustments to your collar. Minor   adjustments may be required over time to improve comfort and reduce pressure on your chin or on the back of your head. °¨ If you are allowed to remove the collar for cleaning or bathing, follow your health care provider's instructions on how to do so safely. °¨ Keep your collar clean by wiping it with mild soap and water and drying it completely. If the collar you have been given includes removable pads, remove them every 1-2 days and hand wash them with soap and water. Allow them to air dry. They should be completely  dry before you wear them in the collar. °¨ If you are allowed to remove the collar for cleaning and bathing, wash and dry the skin of your neck. Check your skin for irritation or sores. If you see any, tell your health care provider. °¨ Do not drive while wearing the collar.   °· Only take over-the-counter or prescription medicines for pain, discomfort, or fever as directed by your health care provider.   °· Keep all follow-up appointments as directed by your health care provider.   °· Keep all physical therapy appointments as directed by your health care provider.   °· Make any needed adjustments to your workstation to promote good posture.   °· Avoid positions and activities that make your symptoms worse.   °· Warm up and stretch before being active to help prevent problems.   °SEEK MEDICAL CARE IF:  °· Your pain is not controlled with medicine.   °· You are unable to decrease your pain medicine over time as planned.   °· Your activity level is not improving as expected.   °SEEK IMMEDIATE MEDICAL CARE IF:  °· You develop any bleeding. °· You develop stomach upset. °· You have signs of an allergic reaction to your medicine.   °· Your symptoms get worse.   °· You develop new, unexplained symptoms.   °· You have numbness, tingling, weakness, or paralysis in any part of your body.   °MAKE SURE YOU:  °· Understand these instructions. °· Will watch your condition. °· Will get help right away if you are not doing well or get worse. °  °This information is not intended to replace advice given to you by your health care provider. Make sure you discuss any questions you have with your health care provider. °  °Document Released: 04/21/2007 Document Revised: 06/29/2013 Document Reviewed: 12/30/2012 °Elsevier Interactive Patient Education ©2016 [employer] ° °Chest Contusion °A chest contusion is a deep bruise on your chest area. Contusions are the result of an injury that caused bleeding under the skin. A chest contusion  may involve bruising of the skin, muscles, or ribs. The contusion may turn blue, purple, or yellow. Minor injuries will give you a painless contusion, but more severe contusions may stay painful and swollen for a few weeks. °CAUSES  °A contusion is usually caused by a blow, trauma, or direct force to an area of the body. °SYMPTOMS  °· Swelling and redness of the injured area. °· Discoloration of the injured area. °· Tenderness and soreness of the injured area. °· Pain. °DIAGNOSIS  °The diagnosis can be made by taking a history and performing a physical exam. An X-ray, CT scan, or MRI may be needed to determine if there were any associated injuries, such as broken bones (fractures) or internal injuries. °TREATMENT  °Often, the best treatment for a chest contusion is resting, icing, and applying cold compresses to the injured area. Deep breathing exercises may be recommended to reduce the risk of pneumonia. Over-the-counter medicines may also be recommended   for pain control. °HOME CARE INSTRUCTIONS  °· Put ice on the injured area. °¨ Put ice in a plastic bag. °¨ Place a towel between your skin and the bag. °¨ Leave the ice on for 15-20 minutes, 03-04 times a day. °· Only take over-the-counter or prescription medicines as directed by your caregiver. Your caregiver may recommend avoiding anti-inflammatory medicines (aspirin, ibuprofen, and naproxen) for 48 hours because these medicines may increase bruising. °· Rest the injured area. °· Perform deep-breathing exercises as directed by your caregiver. °· Stop smoking if you smoke. °· Do not lift objects over 5 pounds (2.3 kg) for 3 days or longer if recommended by your caregiver. °SEEK IMMEDIATE MEDICAL CARE IF:  °· You have increased bruising or swelling. °· You have pain that is getting worse. °· You have difficulty breathing. °· You have dizziness, weakness, or fainting. °· You have blood in your urine or stool. °· You cough up or vomit blood. °· Your swelling or pain  is not relieved with medicines. °MAKE SURE YOU:  °· Understand these instructions. °· Will watch your condition. °· Will get help right away if you are not doing well or get worse. °  °This information is not intended to replace advice given to you by your health care provider. Make sure you discuss any questions you have with your health care provider. °  °Document Released: 03/19/2001 Document Revised: 03/18/2012 Document Reviewed: 12/16/2011 °Elsevier Interactive Patient Education ©2016 [employer] ° °
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
## 2015-09-24 NOTE — ED Notes (Signed)
Spoke with dr. Pershing Proudschaevitz regarding moi. No additional orders received.

## 2015-10-19 DIAGNOSIS — N3010 Interstitial cystitis (chronic) without hematuria: Secondary | ICD-10-CM | POA: Diagnosis not present

## 2015-10-19 DIAGNOSIS — Z0000 Encounter for general adult medical examination without abnormal findings: Secondary | ICD-10-CM | POA: Diagnosis not present

## 2015-11-14 ENCOUNTER — Other Ambulatory Visit: Payer: Self-pay

## 2015-11-14 DIAGNOSIS — Z1231 Encounter for screening mammogram for malignant neoplasm of breast: Secondary | ICD-10-CM

## 2015-11-16 DIAGNOSIS — G44201 Tension-type headache, unspecified, intractable: Secondary | ICD-10-CM | POA: Diagnosis not present

## 2015-11-16 DIAGNOSIS — H9313 Tinnitus, bilateral: Secondary | ICD-10-CM | POA: Diagnosis not present

## 2015-11-16 DIAGNOSIS — H903 Sensorineural hearing loss, bilateral: Secondary | ICD-10-CM | POA: Diagnosis not present

## 2015-11-24 DIAGNOSIS — N3010 Interstitial cystitis (chronic) without hematuria: Secondary | ICD-10-CM | POA: Diagnosis not present

## 2015-11-24 DIAGNOSIS — Z0000 Encounter for general adult medical examination without abnormal findings: Secondary | ICD-10-CM | POA: Diagnosis not present

## 2015-11-28 DIAGNOSIS — R5383 Other fatigue: Secondary | ICD-10-CM | POA: Diagnosis not present

## 2015-11-28 DIAGNOSIS — J069 Acute upper respiratory infection, unspecified: Secondary | ICD-10-CM | POA: Diagnosis not present

## 2015-12-06 ENCOUNTER — Ambulatory Visit: Payer: BLUE CROSS/BLUE SHIELD

## 2015-12-12 ENCOUNTER — Ambulatory Visit
Admission: RE | Admit: 2015-12-12 | Discharge: 2015-12-12 | Disposition: A | Discharge status: Home / Self Care | Payer: BLUE CROSS/BLUE SHIELD | Source: Ambulatory Visit

## 2015-12-12 DIAGNOSIS — Z1231 Encounter for screening mammogram for malignant neoplasm of breast: Secondary | ICD-10-CM | POA: Diagnosis not present

## 2015-12-29 DIAGNOSIS — N3010 Interstitial cystitis (chronic) without hematuria: Secondary | ICD-10-CM | POA: Diagnosis not present

## 2016-02-02 DIAGNOSIS — N3010 Interstitial cystitis (chronic) without hematuria: Secondary | ICD-10-CM | POA: Diagnosis not present

## 2016-02-29 DIAGNOSIS — E559 Vitamin D deficiency, unspecified: Secondary | ICD-10-CM | POA: Diagnosis not present

## 2016-02-29 DIAGNOSIS — I10 Essential (primary) hypertension: Secondary | ICD-10-CM | POA: Diagnosis not present

## 2016-02-29 DIAGNOSIS — Z0000 Encounter for general adult medical examination without abnormal findings: Secondary | ICD-10-CM | POA: Diagnosis not present

## 2016-03-01 DIAGNOSIS — N3946 Mixed incontinence: Secondary | ICD-10-CM | POA: Diagnosis not present

## 2016-03-01 DIAGNOSIS — N3010 Interstitial cystitis (chronic) without hematuria: Secondary | ICD-10-CM | POA: Diagnosis not present

## 2016-03-08 DIAGNOSIS — N3010 Interstitial cystitis (chronic) without hematuria: Secondary | ICD-10-CM | POA: Diagnosis not present

## 2016-03-18 DIAGNOSIS — M9903 Segmental and somatic dysfunction of lumbar region: Secondary | ICD-10-CM | POA: Diagnosis not present

## 2016-03-18 DIAGNOSIS — M545 Low back pain: Secondary | ICD-10-CM | POA: Diagnosis not present

## 2016-03-18 DIAGNOSIS — M9901 Segmental and somatic dysfunction of cervical region: Secondary | ICD-10-CM | POA: Diagnosis not present

## 2016-03-18 DIAGNOSIS — M9902 Segmental and somatic dysfunction of thoracic region: Secondary | ICD-10-CM | POA: Diagnosis not present

## 2016-03-22 DIAGNOSIS — M9903 Segmental and somatic dysfunction of lumbar region: Secondary | ICD-10-CM | POA: Diagnosis not present

## 2016-03-22 DIAGNOSIS — M9902 Segmental and somatic dysfunction of thoracic region: Secondary | ICD-10-CM | POA: Diagnosis not present

## 2016-03-22 DIAGNOSIS — M545 Low back pain: Secondary | ICD-10-CM | POA: Diagnosis not present

## 2016-03-22 DIAGNOSIS — M9901 Segmental and somatic dysfunction of cervical region: Secondary | ICD-10-CM | POA: Diagnosis not present

## 2016-03-25 DIAGNOSIS — M9903 Segmental and somatic dysfunction of lumbar region: Secondary | ICD-10-CM | POA: Diagnosis not present

## 2016-03-25 DIAGNOSIS — M9901 Segmental and somatic dysfunction of cervical region: Secondary | ICD-10-CM | POA: Diagnosis not present

## 2016-03-25 DIAGNOSIS — M545 Low back pain: Secondary | ICD-10-CM | POA: Diagnosis not present

## 2016-03-25 DIAGNOSIS — M9902 Segmental and somatic dysfunction of thoracic region: Secondary | ICD-10-CM | POA: Diagnosis not present

## 2016-04-01 DIAGNOSIS — M9902 Segmental and somatic dysfunction of thoracic region: Secondary | ICD-10-CM | POA: Diagnosis not present

## 2016-04-01 DIAGNOSIS — M9903 Segmental and somatic dysfunction of lumbar region: Secondary | ICD-10-CM | POA: Diagnosis not present

## 2016-04-01 DIAGNOSIS — M9901 Segmental and somatic dysfunction of cervical region: Secondary | ICD-10-CM | POA: Diagnosis not present

## 2016-04-01 DIAGNOSIS — M545 Low back pain: Secondary | ICD-10-CM | POA: Diagnosis not present

## 2016-04-03 DIAGNOSIS — M9903 Segmental and somatic dysfunction of lumbar region: Secondary | ICD-10-CM | POA: Diagnosis not present

## 2016-04-03 DIAGNOSIS — M9902 Segmental and somatic dysfunction of thoracic region: Secondary | ICD-10-CM | POA: Diagnosis not present

## 2016-04-03 DIAGNOSIS — M9901 Segmental and somatic dysfunction of cervical region: Secondary | ICD-10-CM | POA: Diagnosis not present

## 2016-04-03 DIAGNOSIS — M545 Low back pain: Secondary | ICD-10-CM | POA: Diagnosis not present

## 2016-04-08 DIAGNOSIS — M9901 Segmental and somatic dysfunction of cervical region: Secondary | ICD-10-CM | POA: Diagnosis not present

## 2016-04-08 DIAGNOSIS — M545 Low back pain: Secondary | ICD-10-CM | POA: Diagnosis not present

## 2016-04-08 DIAGNOSIS — M9902 Segmental and somatic dysfunction of thoracic region: Secondary | ICD-10-CM | POA: Diagnosis not present

## 2016-04-08 DIAGNOSIS — M9903 Segmental and somatic dysfunction of lumbar region: Secondary | ICD-10-CM | POA: Diagnosis not present

## 2016-04-12 DIAGNOSIS — N3010 Interstitial cystitis (chronic) without hematuria: Secondary | ICD-10-CM | POA: Diagnosis not present

## 2016-04-15 DIAGNOSIS — M9903 Segmental and somatic dysfunction of lumbar region: Secondary | ICD-10-CM | POA: Diagnosis not present

## 2016-04-15 DIAGNOSIS — M9902 Segmental and somatic dysfunction of thoracic region: Secondary | ICD-10-CM | POA: Diagnosis not present

## 2016-04-15 DIAGNOSIS — M545 Low back pain: Secondary | ICD-10-CM | POA: Diagnosis not present

## 2016-04-15 DIAGNOSIS — M9901 Segmental and somatic dysfunction of cervical region: Secondary | ICD-10-CM | POA: Diagnosis not present

## 2016-04-17 DIAGNOSIS — M9901 Segmental and somatic dysfunction of cervical region: Secondary | ICD-10-CM | POA: Diagnosis not present

## 2016-04-17 DIAGNOSIS — M9902 Segmental and somatic dysfunction of thoracic region: Secondary | ICD-10-CM | POA: Diagnosis not present

## 2016-04-17 DIAGNOSIS — M545 Low back pain: Secondary | ICD-10-CM | POA: Diagnosis not present

## 2016-04-17 DIAGNOSIS — M9903 Segmental and somatic dysfunction of lumbar region: Secondary | ICD-10-CM | POA: Diagnosis not present

## 2016-04-29 DIAGNOSIS — D225 Melanocytic nevi of trunk: Secondary | ICD-10-CM | POA: Diagnosis not present

## 2016-04-29 DIAGNOSIS — L821 Other seborrheic keratosis: Secondary | ICD-10-CM | POA: Diagnosis not present

## 2016-04-29 DIAGNOSIS — L918 Other hypertrophic disorders of the skin: Secondary | ICD-10-CM | POA: Diagnosis not present

## 2016-04-29 DIAGNOSIS — L718 Other rosacea: Secondary | ICD-10-CM | POA: Diagnosis not present

## 2016-05-08 DIAGNOSIS — N3946 Mixed incontinence: Secondary | ICD-10-CM | POA: Diagnosis not present

## 2016-05-08 DIAGNOSIS — N3010 Interstitial cystitis (chronic) without hematuria: Secondary | ICD-10-CM | POA: Diagnosis not present

## 2016-05-17 DIAGNOSIS — N3010 Interstitial cystitis (chronic) without hematuria: Secondary | ICD-10-CM | POA: Diagnosis not present

## 2016-06-07 DIAGNOSIS — Z23 Encounter for immunization: Secondary | ICD-10-CM | POA: Diagnosis not present

## 2016-06-20 DIAGNOSIS — R351 Nocturia: Secondary | ICD-10-CM | POA: Diagnosis not present

## 2016-06-20 DIAGNOSIS — R350 Frequency of micturition: Secondary | ICD-10-CM | POA: Diagnosis not present

## 2016-06-20 DIAGNOSIS — N3010 Interstitial cystitis (chronic) without hematuria: Secondary | ICD-10-CM | POA: Diagnosis not present

## 2016-06-20 DIAGNOSIS — N3946 Mixed incontinence: Secondary | ICD-10-CM | POA: Diagnosis not present

## 2016-06-28 DIAGNOSIS — N3010 Interstitial cystitis (chronic) without hematuria: Secondary | ICD-10-CM | POA: Diagnosis not present

## 2016-07-26 DIAGNOSIS — N3946 Mixed incontinence: Secondary | ICD-10-CM | POA: Diagnosis not present

## 2016-07-26 DIAGNOSIS — N3010 Interstitial cystitis (chronic) without hematuria: Secondary | ICD-10-CM | POA: Diagnosis not present

## 2016-07-30 ENCOUNTER — Telehealth: Payer: Self-pay | Admitting: *Deleted

## 2016-07-30 NOTE — Telephone Encounter (Signed)
Pt called and left message in traige voicemail requesting Rx for tamilflu, I called pt back and told her we do not treat the flu, best to be seen at PCP office or urgent care. Pt verbalized she understood.

## 2016-08-09 DIAGNOSIS — N3010 Interstitial cystitis (chronic) without hematuria: Secondary | ICD-10-CM | POA: Diagnosis not present

## 2016-08-19 ENCOUNTER — Other Ambulatory Visit: Payer: Self-pay | Admitting: Women's Health

## 2016-08-19 DIAGNOSIS — Z79890 Hormone replacement therapy (postmenopausal): Secondary | ICD-10-CM

## 2016-08-29 ENCOUNTER — Ambulatory Visit (INDEPENDENT_AMBULATORY_CARE_PROVIDER_SITE_OTHER): Payer: BLUE CROSS/BLUE SHIELD | Admitting: Women's Health

## 2016-08-29 ENCOUNTER — Encounter: Payer: Self-pay | Admitting: Women's Health

## 2016-08-29 ENCOUNTER — Telehealth: Payer: Self-pay | Admitting: *Deleted

## 2016-08-29 VITALS — BP 124/86 | Ht 61.0 in | Wt 197.0 lb

## 2016-08-29 DIAGNOSIS — N393 Stress incontinence (female) (male): Secondary | ICD-10-CM

## 2016-08-29 DIAGNOSIS — Z79890 Hormone replacement therapy (postmenopausal): Secondary | ICD-10-CM | POA: Diagnosis not present

## 2016-08-29 DIAGNOSIS — N952 Postmenopausal atrophic vaginitis: Secondary | ICD-10-CM

## 2016-08-29 DIAGNOSIS — Z13820 Encounter for screening for osteoporosis: Secondary | ICD-10-CM

## 2016-08-29 DIAGNOSIS — Z01419 Encounter for gynecological examination (general) (routine) without abnormal findings: Secondary | ICD-10-CM | POA: Diagnosis not present

## 2016-08-29 MED ORDER — ESTRADIOL 10 MCG VA TABS: ORAL_TABLET | VAGINAL | 11 refills | Status: DC

## 2016-08-29 MED ORDER — ESTRADIOL 0.1 MG/24HR TD PTTW: 1.0000 | MEDICATED_PATCH | TRANSDERMAL | 11 refills | Status: DC

## 2016-08-29 NOTE — Progress Notes (Signed)
[name] 10-01-1955 [id number]    History:    Presents for annual exam.  TAH and LSO for endometriosis on Vivelle Dot with continued hot flushes. Normal DEXA 2011. Hypertension/hypothyroidism in IC managed by primary care. Has had some problems with stress incontinence has seen a urologist. Has not had a screening colonoscopy. Zostavax 2017.  Past medical history, past surgical history, family history and social history were all reviewed and documented in the EPIC chart.  Helping with her 2 grandchildren ages 1 and 2-1/2.  ROS:  A ROS was performed and pertinent positives and negatives are included.  Exam:  Vitals:   08/29/16 1145  BP: 124/86  Weight: 197 lb (89.4 kg)  Height: 5\' 1"  (1.549 m)   Body mass index is 37.22 kg/m.   General appearance:  Normal Thyroid:  Symmetrical, normal in size, without palpable masses or nodularity. Respiratory  Auscultation:  Clear without wheezing or rhonchi Cardiovascular  Auscultation:  Regular rate, without rubs, murmurs or gallops  Edema/varicosities:  Not grossly evident Abdominal  Soft,nontender, without masses, guarding or rebound.  Liver/spleen:  No organomegaly noted  Hernia:  None appreciated  Skin  Inspection:  Grossly normal   Breasts: Examined lying and sitting.     Right: Without masses, retractions, discharge or axillary adenopathy.     Left: Without masses, retractions, discharge or axillary adenopathy. Gentitourinary   Inguinal/mons:  Normal without inguinal adenopathy  External genitalia:  Normal  BUS/Urethra/Skene's glands:  Normal  Vagina:  Normal  Cervix:  And uterus absent Adnexa/parametria:     Rt: Without masses or tenderness.   Lt: Without masses or tenderness.  Anus and perineum: Normal  Digital rectal exam: Normal sphincter tone without palpated masses or tenderness  Assessment/Plan:  61 y.o. engaged WF G2 P1 for annual exam.     TAH with LSO on Vivelle Dot with continued hot  flushes Anxiety/depression, hypertension, hypothyroidism-primary care manages labs and meds Stress incontinence  Plan: Vivelle Dot prescription, proper use given and reviewed, slight risk for blood clots, strokes and breast cancer, states cannot tolerate being off . Vagifem 1 applicator at bedtime for 2 weeks and then twice weekly thereafter, reviewed may help with some of the stress incontinence since not a surgical candidate. Lifestyle changes may also help and reviewed. Had lost 850 pounds/year but has gained most of it back Weight Watchers, states is going to start again. SBE's, annual screening mammogram, calcium rich diet, vitamin D 2000 daily encouraged. Repeat DEXA. Home safety, fall prevention and importance of weightbearing exercise reviewed. Has not had a colonoscopy, [facility] information given instructed to schedule.    Harrington ChallengerYOUNG,NANCY J [facility] Center LLCWHNP, 12:37 PM 08/29/2016

## 2016-08-29 NOTE — Patient Instructions (Signed)
Dr C[facility]  [phone]   Health Maintenance for Postmenopausal Women Introduction Menopause is a normal process in which your reproductive ability comes to an end. This process happens gradually over a span of months to years, usually between the ages of 16 and 80. Menopause is complete when you have missed 12 consecutive menstrual periods. It is important to talk with your health care provider about some of the most common conditions that affect postmenopausal women, such as heart disease, cancer, and bone loss (osteoporosis). Adopting a healthy lifestyle and getting preventive care can help to promote your health and wellness. Those actions can also lower your chances of developing some of these common conditions. What should I know about menopause? During menopause, you may experience a number of symptoms, such as:  Moderate-to-severe hot flashes.  Night sweats.  Decrease in sex drive.  Mood swings.  Headaches.  Tiredness.  Irritability.  Memory problems.  Insomnia. Choosing to treat or not to treat menopausal changes is an individual decision that you make with your health care provider. What should I know about hormone replacement therapy and supplements? Hormone therapy products are effective for treating symptoms that are associated with menopause, such as hot flashes and night sweats. Hormone replacement carries certain risks, especially as you become older. If you are thinking about using estrogen or estrogen with progestin treatments, discuss the benefits and risks with your health care provider. What should I know about heart disease and stroke? Heart disease, heart attack, and stroke become more likely as you age. This may be due, in part, to the hormonal changes that your body experiences during menopause. These can affect how your body processes dietary fats, triglycerides, and cholesterol. Heart attack and stroke are both medical emergencies. There are many  things that you can do to help prevent heart disease and stroke:  Have your blood pressure checked at least every 1-2 years. High blood pressure causes heart disease and increases the risk of stroke.  If you are 73-61 years old, ask your health care provider if you should take aspirin to prevent a heart attack or a stroke.  Do not use any tobacco products, including cigarettes, chewing tobacco, or electronic cigarettes. If you need help quitting, ask your health care provider.  It is important to eat a healthy diet and maintain a healthy weight.  Be sure to include plenty of vegetables, fruits, low-fat dairy products, and lean protein.  Avoid eating foods that are high in solid fats, added sugars, or salt (sodium).  Get regular exercise. This is one of the most important things that you can do for your health.  Try to exercise for at least 150 minutes each week. The type of exercise that you do should increase your heart rate and make you sweat. This is known as moderate-intensity exercise.  Try to do strengthening exercises at least twice each week. Do these in addition to the moderate-intensity exercise.  Know your numbers.Ask your health care provider to check your cholesterol and your blood glucose. Continue to have your blood tested as directed by your health care provider. What should I know about cancer screening? There are several types of cancer. Take the following steps to reduce your risk and to catch any cancer development as early as possible. Breast Cancer  Practice breast self-awareness.  This means understanding how your breasts normally appear and feel.  It also means doing regular breast self-exams. Let your health care provider know about any changes, no matter  how small.  If you are 32 or older, have a clinician do a breast exam (clinical breast exam or CBE) every year. Depending on your age, family history, and medical history, it may be recommended that you also  have a yearly breast X-ray (mammogram).  If you have a family history of breast cancer, talk with your health care provider about genetic screening.  If you are at high risk for breast cancer, talk with your health care provider about having an MRI and a mammogram every year.  Breast cancer (BRCA) gene test is recommended for women who have family members with BRCA-related cancers. Results of the assessment will determine the need for genetic counseling and BRCA1 and for BRCA2 testing. BRCA-related cancers include these types:  Breast. This occurs in males or females.  Ovarian.  Tubal. This may also be called fallopian tube cancer.  Cancer of the abdominal or pelvic lining (peritoneal cancer).  Prostate.  Pancreatic. Cervical, Uterine, and Ovarian Cancer  Your health care provider may recommend that you be screened regularly for cancer of the pelvic organs. These include your ovaries, uterus, and vagina. This screening involves a pelvic exam, which includes checking for microscopic changes to the surface of your cervix (Pap test).  For women ages 21-65, health care providers may recommend a pelvic exam and a Pap test every three years. For women ages 38-65, they may recommend the Pap test and pelvic exam, combined with testing for human papilloma virus (HPV), every five years. Some types of HPV increase your risk of cervical cancer. Testing for HPV may also be done on women of any age who have unclear Pap test results.  Other health care providers may not recommend any screening for nonpregnant women who are considered low risk for pelvic cancer and have no symptoms. Ask your health care provider if a screening pelvic exam is right for you.  If you have had past treatment for cervical cancer or a condition that could lead to cancer, you need Pap tests and screening for cancer for at least 20 years after your treatment. If Pap tests have been discontinued for you, your risk factors (such as  having a new sexual partner) need to be reassessed to determine if you should start having screenings again. Some women have medical problems that increase the chance of getting cervical cancer. In these cases, your health care provider may recommend that you have screening and Pap tests more often.  If you have a family history of uterine cancer or ovarian cancer, talk with your health care provider about genetic screening.  If you have vaginal bleeding after reaching menopause, tell your health care provider.  There are currently no reliable tests available to screen for ovarian cancer. Lung Cancer  Lung cancer screening is recommended for adults 32-37 years old who are at high risk for lung cancer because of a history of smoking. A yearly low-dose CT scan of the lungs is recommended if you:  Currently smoke.  Have a history of at least 30 pack-years of smoking and you currently smoke or have quit within the past 15 years. A pack-year is smoking an average of one pack of cigarettes per day for one year. Yearly screening should:  Continue until it has been 15 years since you quit.  Stop if you develop a health problem that would prevent you from having lung cancer treatment. Colorectal Cancer  This type of cancer can be detected and can often be prevented.  Routine colorectal  cancer screening usually begins at age 44 and continues through age 22.  If you have risk factors for colon cancer, your health care provider may recommend that you be screened at an earlier age.  If you have a family history of colorectal cancer, talk with your health care provider about genetic screening.  Your health care provider may also recommend using home test kits to check for hidden blood in your stool.  A small camera at the end of a tube can be used to examine your colon directly (sigmoidoscopy or colonoscopy). This is done to check for the earliest forms of colorectal cancer.  Direct examination of  the colon should be repeated every 5-10 years until age 14. However, if early forms of precancerous polyps or small growths are found or if you have a family history or genetic risk for colorectal cancer, you may need to be screened more often. Skin Cancer  Check your skin from head to toe regularly.  Monitor any moles. Be sure to tell your health care provider:  About any new moles or changes in moles, especially if there is a change in a mole's shape or color.  If you have a mole that is larger than the size of a pencil eraser.  If any of your family members has a history of skin cancer, especially at a young age, talk with your health care provider about genetic screening.  Always use sunscreen. Apply sunscreen liberally and repeatedly throughout the day.  Whenever you are outside, protect yourself by wearing long sleeves, pants, a wide-brimmed hat, and sunglasses. What should I know about osteoporosis? Osteoporosis is a condition in which bone destruction happens more quickly than new bone creation. After menopause, you may be at an increased risk for osteoporosis. To help prevent osteoporosis or the bone fractures that can happen because of osteoporosis, the following is recommended:  If you are 25-104 years old, get at least 1,000 mg of calcium and at least 600 mg of vitamin D per day.  If you are older than age 34 but younger than age 43, get at least 1,200 mg of calcium and at least 600 mg of vitamin D per day.  If you are older than age 71, get at least 1,200 mg of calcium and at least 800 mg of vitamin D per day. Smoking and excessive alcohol intake increase the risk of osteoporosis. Eat foods that are rich in calcium and vitamin D, and do weight-bearing exercises several times each week as directed by your health care provider. What should I know about how menopause affects my mental health? Depression may occur at any age, but it is more common as you become older. Common  symptoms of depression include:  Low or sad mood.  Changes in sleep patterns.  Changes in appetite or eating patterns.  Feeling an overall lack of motivation or enjoyment of activities that you previously enjoyed.  Frequent crying spells. Talk with your health care provider if you think that you are experiencing depression. What should I know about immunizations? It is important that you get and maintain your immunizations. These include:  Tetanus, diphtheria, and pertussis (Tdap) booster vaccine.  Influenza every year before the flu season begins.  Pneumonia vaccine.  Shingles vaccine. Your health care provider may also recommend other immunizations. This information is not intended to replace advice given to you by your health care provider. Make sure you discuss any questions you have with your health care provider. Document Released: 08/16/2005  Document Revised: 01/12/2016 Document Reviewed: 03/28/2015  2017 [employer]

## 2016-08-29 NOTE — Telephone Encounter (Signed)
Pharmacy called to clarify  directions for vagifiem 10 mcg, I send new Rx in with "Vagifem 1 tablet at bedtime for 2 weeks and then twice weekly thereafter,

## 2016-09-19 DIAGNOSIS — N3010 Interstitial cystitis (chronic) without hematuria: Secondary | ICD-10-CM | POA: Diagnosis not present

## 2016-11-01 DIAGNOSIS — N3010 Interstitial cystitis (chronic) without hematuria: Secondary | ICD-10-CM | POA: Diagnosis not present

## 2016-11-20 ENCOUNTER — Encounter: Payer: Self-pay | Admitting: Gynecology

## 2016-12-09 DIAGNOSIS — H10011 Acute follicular conjunctivitis, right eye: Secondary | ICD-10-CM | POA: Diagnosis not present

## 2016-12-16 DIAGNOSIS — H10011 Acute follicular conjunctivitis, right eye: Secondary | ICD-10-CM | POA: Diagnosis not present

## 2016-12-27 DIAGNOSIS — N3010 Interstitial cystitis (chronic) without hematuria: Secondary | ICD-10-CM | POA: Diagnosis not present

## 2017-01-01 DIAGNOSIS — I10 Essential (primary) hypertension: Secondary | ICD-10-CM | POA: Diagnosis not present

## 2017-01-01 DIAGNOSIS — E039 Hypothyroidism, unspecified: Secondary | ICD-10-CM | POA: Diagnosis not present

## 2017-01-01 DIAGNOSIS — E559 Vitamin D deficiency, unspecified: Secondary | ICD-10-CM | POA: Diagnosis not present

## 2017-01-01 DIAGNOSIS — Z13220 Encounter for screening for lipoid disorders: Secondary | ICD-10-CM | POA: Diagnosis not present

## 2017-01-31 DIAGNOSIS — N3010 Interstitial cystitis (chronic) without hematuria: Secondary | ICD-10-CM | POA: Diagnosis not present

## 2017-03-06 DIAGNOSIS — N3010 Interstitial cystitis (chronic) without hematuria: Secondary | ICD-10-CM | POA: Diagnosis not present

## 2017-04-17 DIAGNOSIS — N3010 Interstitial cystitis (chronic) without hematuria: Secondary | ICD-10-CM | POA: Diagnosis not present

## 2017-05-27 DIAGNOSIS — N3010 Interstitial cystitis (chronic) without hematuria: Secondary | ICD-10-CM | POA: Diagnosis not present

## 2017-06-05 ENCOUNTER — Other Ambulatory Visit: Payer: Self-pay | Admitting: Women's Health

## 2017-06-05 DIAGNOSIS — Z1231 Encounter for screening mammogram for malignant neoplasm of breast: Secondary | ICD-10-CM

## 2017-06-17 ENCOUNTER — Ambulatory Visit: Payer: BLUE CROSS/BLUE SHIELD

## 2017-06-25 ENCOUNTER — Other Ambulatory Visit: Payer: Self-pay

## 2017-06-25 DIAGNOSIS — Z79890 Hormone replacement therapy (postmenopausal): Secondary | ICD-10-CM

## 2017-06-25 DIAGNOSIS — H10011 Acute follicular conjunctivitis, right eye: Secondary | ICD-10-CM | POA: Diagnosis not present

## 2017-06-25 MED ORDER — ESTRADIOL 0.1 MG/24HR TD PTTW: 1.0000 | MEDICATED_PATCH | TRANSDERMAL | 0 refills | Status: DC

## 2017-06-30 DIAGNOSIS — N3946 Mixed incontinence: Secondary | ICD-10-CM | POA: Diagnosis not present

## 2017-06-30 DIAGNOSIS — N3010 Interstitial cystitis (chronic) without hematuria: Secondary | ICD-10-CM | POA: Diagnosis not present

## 2017-07-04 DIAGNOSIS — N3010 Interstitial cystitis (chronic) without hematuria: Secondary | ICD-10-CM | POA: Diagnosis not present

## 2017-07-09 ENCOUNTER — Telehealth: Payer: Self-pay | Admitting: *Deleted

## 2017-07-09 NOTE — Telephone Encounter (Signed)
Pt said she is still taking her HRT will follow up with PCP regarding hair loss.

## 2017-07-09 NOTE — Telephone Encounter (Signed)
Patient called has noticed increases hair loss over several month, asked if could be hormone related? Asked if labs can be drawn if so? Should pt schedule OV with you first? Please advise

## 2017-07-09 NOTE — Telephone Encounter (Signed)
Please call, she is hypothyroid by primary care manages Synthroid. She is postmenopausal on HRT if she still taking? her annual exam is due in February. What hormones was she thinking about having drawn?

## 2017-07-29 ENCOUNTER — Ambulatory Visit: Payer: BLUE CROSS/BLUE SHIELD

## 2017-08-12 DIAGNOSIS — I10 Essential (primary) hypertension: Secondary | ICD-10-CM | POA: Diagnosis not present

## 2017-08-12 DIAGNOSIS — E039 Hypothyroidism, unspecified: Secondary | ICD-10-CM | POA: Diagnosis not present

## 2017-08-12 DIAGNOSIS — E559 Vitamin D deficiency, unspecified: Secondary | ICD-10-CM | POA: Diagnosis not present

## 2017-08-12 DIAGNOSIS — F411 Generalized anxiety disorder: Secondary | ICD-10-CM | POA: Diagnosis not present

## 2017-08-20 ENCOUNTER — Ambulatory Visit: Payer: BLUE CROSS/BLUE SHIELD

## 2017-08-29 DIAGNOSIS — J0100 Acute maxillary sinusitis, unspecified: Secondary | ICD-10-CM | POA: Diagnosis not present

## 2017-09-05 ENCOUNTER — Ambulatory Visit
Admission: RE | Admit: 2017-09-05 | Discharge: 2017-09-05 | Disposition: A | Discharge status: Home / Self Care | Payer: BLUE CROSS/BLUE SHIELD | Source: Ambulatory Visit | Attending: Women's Health | Admitting: Women's Health

## 2017-09-05 DIAGNOSIS — Z1231 Encounter for screening mammogram for malignant neoplasm of breast: Secondary | ICD-10-CM | POA: Diagnosis not present
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
## 2017-09-12 DIAGNOSIS — N3010 Interstitial cystitis (chronic) without hematuria: Secondary | ICD-10-CM | POA: Diagnosis not present

## 2017-09-23 ENCOUNTER — Other Ambulatory Visit: Payer: Self-pay | Admitting: Women's Health

## 2017-09-23 DIAGNOSIS — Z79890 Hormone replacement therapy (postmenopausal): Secondary | ICD-10-CM

## 2017-09-24 ENCOUNTER — Other Ambulatory Visit: Payer: Self-pay

## 2017-09-24 DIAGNOSIS — N393 Stress incontinence (female) (male): Secondary | ICD-10-CM

## 2017-09-24 DIAGNOSIS — N952 Postmenopausal atrophic vaginitis: Secondary | ICD-10-CM

## 2017-09-24 MED ORDER — ESTRADIOL 10 MCG VA TABS: ORAL_TABLET | VAGINAL | 0 refills | Status: DC

## 2017-10-31 DIAGNOSIS — N3010 Interstitial cystitis (chronic) without hematuria: Secondary | ICD-10-CM | POA: Diagnosis not present

## 2017-12-03 ENCOUNTER — Ambulatory Visit: Payer: BLUE CROSS/BLUE SHIELD | Admitting: Women's Health

## 2017-12-03 ENCOUNTER — Encounter: Payer: Self-pay | Admitting: Women's Health

## 2017-12-03 VITALS — BP 122/80 | Ht 61.0 in | Wt 195.0 lb

## 2017-12-03 DIAGNOSIS — Z79890 Hormone replacement therapy (postmenopausal): Secondary | ICD-10-CM

## 2017-12-03 DIAGNOSIS — Z13820 Encounter for screening for osteoporosis: Secondary | ICD-10-CM | POA: Diagnosis not present

## 2017-12-03 DIAGNOSIS — Z01419 Encounter for gynecological examination (general) (routine) without abnormal findings: Secondary | ICD-10-CM | POA: Diagnosis not present

## 2017-12-03 MED ORDER — ESTRADIOL 0.1 MG/24HR TD PTTW: 1.0000 | MEDICATED_PATCH | TRANSDERMAL | 4 refills | Status: DC

## 2017-12-03 NOTE — Patient Instructions (Signed)
Back Pain, Adult Back pain is very common. The pain often gets better over time. The cause of back pain is usually not dangerous. Most people can learn to manage their back pain on their own. Follow these instructions at home: Watch your back pain for any changes. The following actions may help to lessen any pain you are feeling:  Stay active. Start with short walks on flat ground if you can. Try to walk farther each day.  Exercise regularly as told by your doctor. Exercise helps your back heal faster. It also helps avoid future injury by keeping your muscles strong and flexible.  Do not sit, drive, or stand in one place for more than 30 minutes.  Do not stay in bed. Resting more than 1-2 days can slow down your recovery.  Be careful when you bend or lift an object. Use good form when lifting: ? Bend at your knees. ? Keep the object close to your body. ? Do not twist.  Sleep on a firm mattress. Lie on your side, and bend your knees. If you lie on your back, put a pillow under your knees.  Take medicines only as told by your doctor.  Put ice on the injured area. ? Put ice in a plastic bag. ? Place a towel between your skin and the bag. ? Leave the ice on for 20 minutes, 2-3 times a day for the first 2-3 days. After that, you can switch between ice and heat packs.  Avoid feeling anxious or stressed. Find good ways to deal with stress, such as exercise.  Maintain a healthy weight. Extra weight puts stress on your back.  Contact a doctor if:  You have pain that does not go away with rest or medicine.  You have worsening pain that goes down into your legs or buttocks.  You have pain that does not get better in one week.  You have pain at night.  You lose weight.  You have a fever or chills. Get help right away if:  You cannot control when you poop (bowel movement) or pee (urinate).  Your arms or legs feel weak.  Your arms or legs lose feeling (numbness).  You feel sick  to your stomach (nauseous) or throw up (vomit).  You have belly (abdominal) pain.  You feel like you may pass out (faint). This information is not intended to replace advice given to you by your health care provider. Make sure you discuss any questions you have with your health care provider. Document Released: 12/11/2007 Document Revised: 11/30/2015 Document Reviewed: 10/26/2013 Elsevier Interactive Patient Education  2018 [employer]. Carbohydrate Counting for Diabetes Mellitus, Adult Carbohydrate counting is a method for keeping track of how many carbohydrates you eat. Eating carbohydrates naturally increases the amount of sugar (glucose) in the blood. Counting how many carbohydrates you eat helps keep your blood glucose within normal limits, which helps you manage your diabetes (diabetes mellitus). It is important to know how many carbohydrates you can safely have in each meal. This is different for every person. A diet and nutrition specialist (registered dietitian) can help you make a meal plan and calculate how many carbohydrates you should have at each meal and snack. Carbohydrates are found in the following foods:  Grains, such as breads and cereals.  Dried beans and soy products.  Starchy vegetables, such as potatoes, peas, and corn.  Fruit and fruit juices.  Milk and yogurt.  Sweets and snack foods, such as cake, cookies, candy, chips,  and soft drinks.  How do I count carbohydrates? There are two ways to count carbohydrates in food. You can use either of the methods or a combination of both. Reading "Nutrition Facts" on packaged food The "Nutrition Facts" list is included on the labels of almost all packaged foods and beverages in the [place] It includes:  The serving size.  Information about nutrients in each serving, including the grams (g) of carbohydrate per serving.  To use the "Nutrition Facts":  Decide how many servings you will have.  Multiply the number of  servings by the number of carbohydrates per serving.  The resulting number is the total amount of carbohydrates that you will be having.  Learning standard serving sizes of other foods When you eat foods containing carbohydrates that are not packaged or do not include "Nutrition Facts" on the label, you need to measure the servings in order to count the amount of carbohydrates:  Measure the foods that you will eat with a food scale or measuring cup, if needed.  Decide how many standard-size servings you will eat.  Multiply the number of servings by 15. Most carbohydrate-rich foods have about 15 g of carbohydrates per serving. ? For example, if you eat 8 oz (170 g) of strawberries, you will have eaten 2 servings and 30 g of carbohydrates (2 servings x 15 g = 30 g).  For foods that have more than one food mixed, such as soups and casseroles, you must count the carbohydrates in each food that is included.  The following list contains standard serving sizes of common carbohydrate-rich foods. Each of these servings has about 15 g of carbohydrates:   hamburger bun or  English muffin.   oz (15 mL) syrup.   oz (14 g) jelly.  1 slice of bread.  1 six-inch tortilla.  3 oz (85 g) cooked rice or pasta.  4 oz (113 g) cooked dried beans.  4 oz (113 g) starchy vegetable, such as peas, corn, or potatoes.  4 oz (113 g) hot cereal.  4 oz (113 g) mashed potatoes or  of a large baked potato.  4 oz (113 g) canned or frozen fruit.  4 oz (120 mL) fruit juice.  4-6 crackers.  6 chicken nuggets.  6 oz (170 g) unsweetened dry cereal.  6 oz (170 g) plain fat-free yogurt or yogurt sweetened with artificial sweeteners.  8 oz (240 mL) milk.  8 oz (170 g) fresh fruit or one small piece of fruit.  24 oz (680 g) popped popcorn.  Example of carbohydrate counting Sample meal  3 oz (85 g) chicken breast.  6 oz (170 g) brown rice.  4 oz (113 g) corn.  8 oz (240 mL) milk.  8 oz (170  g) strawberries with sugar-free whipped topping. Carbohydrate calculation 1. Identify the foods that contain carbohydrates: ? Rice. ? Corn. ? Milk. ? Strawberries. 2. Calculate how many servings you have of each food: ? 2 servings rice. ? 1 serving corn. ? 1 serving milk. ? 1 serving strawberries. 3. Multiply each number of servings by 15 g: ? 2 servings rice x 15 g = 30 g. ? 1 serving corn x 15 g = 15 g. ? 1 serving milk x 15 g = 15 g. ? 1 serving strawberries x 15 g = 15 g. 4. Add together all of the amounts to find the total grams of carbohydrates eaten: ? 30 g + 15 g + 15 g + 15 g = 75  g of carbohydrates total. This information is not intended to replace advice given to you by your health care provider. Make sure you discuss any questions you have with your health care provider. Document Released: 06/24/2005 Document Revised: 01/12/2016 Document Reviewed: 12/06/2015 Elsevier Interactive Patient Education  2018 [place] Maintenance for Postmenopausal Women Menopause is a normal process in which your reproductive ability comes to an end. This process happens gradually over a span of months to years, usually between the ages of 84 and 64. Menopause is complete when you have missed 12 consecutive menstrual periods. It is important to talk with your health care provider about some of the most common conditions that affect postmenopausal women, such as heart disease, cancer, and bone loss (osteoporosis). Adopting a healthy lifestyle and getting preventive care can help to promote your health and wellness. Those actions can also lower your chances of developing some of these common conditions. What should I know about menopause? During menopause, you may experience a number of symptoms, such as:  Moderate-to-severe hot flashes.  Night sweats.  Decrease in sex drive.  Mood swings.  Headaches.  Tiredness.  Irritability.  Memory problems.  Insomnia.  Choosing to  treat or not to treat menopausal changes is an individual decision that you make with your health care provider. What should I know about hormone replacement therapy and supplements? Hormone therapy products are effective for treating symptoms that are associated with menopause, such as hot flashes and night sweats. Hormone replacement carries certain risks, especially as you become older. If you are thinking about using estrogen or estrogen with progestin treatments, discuss the benefits and risks with your health care provider. What should I know about heart disease and stroke? Heart disease, heart attack, and stroke become more likely as you age. This may be due, in part, to the hormonal changes that your body experiences during menopause. These can affect how your body processes dietary fats, triglycerides, and cholesterol. Heart attack and stroke are both medical emergencies. There are many things that you can do to help prevent heart disease and stroke:  Have your blood pressure checked at least every 1-2 years. High blood pressure causes heart disease and increases the risk of stroke.  If you are 48-78 years old, ask your health care provider if you should take aspirin to prevent a heart attack or a stroke.  Do not use any tobacco products, including cigarettes, chewing tobacco, or electronic cigarettes. If you need help quitting, ask your health care provider.  It is important to eat a healthy diet and maintain a healthy weight. ? Be sure to include plenty of vegetables, fruits, low-fat dairy products, and lean protein. ? Avoid eating foods that are high in solid fats, added sugars, or salt (sodium).  Get regular exercise. This is one of the most important things that you can do for your health. ? Try to exercise for at least 150 minutes each week. The type of exercise that you do should increase your heart rate and make you sweat. This is known as moderate-intensity exercise. ? Try to do  strengthening exercises at least twice each week. Do these in addition to the moderate-intensity exercise.  Know your numbers.Ask your health care provider to check your cholesterol and your blood glucose. Continue to have your blood tested as directed by your health care provider.  What should I know about cancer screening? There are several types of cancer. Take the following steps to reduce your risk and to  catch any cancer development as early as possible. Breast Cancer  Practice breast self-awareness. ? This means understanding how your breasts normally appear and feel. ? It also means doing regular breast self-exams. Let your health care provider know about any changes, no matter how small.  If you are 42 or older, have a clinician do a breast exam (clinical breast exam or CBE) every year. Depending on your age, family history, and medical history, it may be recommended that you also have a yearly breast X-ray (mammogram).  If you have a family history of breast cancer, talk with your health care provider about genetic screening.  If you are at high risk for breast cancer, talk with your health care provider about having an MRI and a mammogram every year.  Breast cancer (BRCA) gene test is recommended for women who have family members with BRCA-related cancers. Results of the assessment will determine the need for genetic counseling and BRCA1 and for BRCA2 testing. BRCA-related cancers include these types: ? Breast. This occurs in males or females. ? Ovarian. ? Tubal. This may also be called fallopian tube cancer. ? Cancer of the abdominal or pelvic lining (peritoneal cancer). ? Prostate. ? Pancreatic.  Cervical, Uterine, and Ovarian Cancer Your health care provider may recommend that you be screened regularly for cancer of the pelvic organs. These include your ovaries, uterus, and vagina. This screening involves a pelvic exam, which includes checking for microscopic changes to the  surface of your cervix (Pap test).  For women ages 21-65, health care providers may recommend a pelvic exam and a Pap test every three years. For women ages 65-65, they may recommend the Pap test and pelvic exam, combined with testing for human papilloma virus (HPV), every five years. Some types of HPV increase your risk of cervical cancer. Testing for HPV may also be done on women of any age who have unclear Pap test results.  Other health care providers may not recommend any screening for nonpregnant women who are considered low risk for pelvic cancer and have no symptoms. Ask your health care provider if a screening pelvic exam is right for you.  If you have had past treatment for cervical cancer or a condition that could lead to cancer, you need Pap tests and screening for cancer for at least 20 years after your treatment. If Pap tests have been discontinued for you, your risk factors (such as having a new sexual partner) need to be reassessed to determine if you should start having screenings again. Some women have medical problems that increase the chance of getting cervical cancer. In these cases, your health care provider may recommend that you have screening and Pap tests more often.  If you have a family history of uterine cancer or ovarian cancer, talk with your health care provider about genetic screening.  If you have vaginal bleeding after reaching menopause, tell your health care provider.  There are currently no reliable tests available to screen for ovarian cancer.  Lung Cancer Lung cancer screening is recommended for adults 41-4 years old who are at high risk for lung cancer because of a history of smoking. A yearly low-dose CT scan of the lungs is recommended if you:  Currently smoke.  Have a history of at least 30 pack-years of smoking and you currently smoke or have quit within the past 15 years. A pack-year is smoking an average of one pack of cigarettes per day for one  year.  Yearly screening should:  Continue until it has been 15 years since you quit.  Stop if you develop a health problem that would prevent you from having lung cancer treatment.  Colorectal Cancer  This type of cancer can be detected and can often be prevented.  Routine colorectal cancer screening usually begins at age 11 and continues through age 39.  If you have risk factors for colon cancer, your health care provider may recommend that you be screened at an earlier age.  If you have a family history of colorectal cancer, talk with your health care provider about genetic screening.  Your health care provider may also recommend using home test kits to check for hidden blood in your stool.  A small camera at the end of a tube can be used to examine your colon directly (sigmoidoscopy or colonoscopy). This is done to check for the earliest forms of colorectal cancer.  Direct examination of the colon should be repeated every 5-10 years until age 27. However, if early forms of precancerous polyps or small growths are found or if you have a family history or genetic risk for colorectal cancer, you may need to be screened more often.  Skin Cancer  Check your skin from head to toe regularly.  Monitor any moles. Be sure to tell your health care provider: ? About any new moles or changes in moles, especially if there is a change in a mole's shape or color. ? If you have a mole that is larger than the size of a pencil eraser.  If any of your family members has a history of skin cancer, especially at a young age, talk with your health care provider about genetic screening.  Always use sunscreen. Apply sunscreen liberally and repeatedly throughout the day.  Whenever you are outside, protect yourself by wearing long sleeves, pants, a wide-brimmed hat, and sunglasses.  What should I know about osteoporosis? Osteoporosis is a condition in which bone destruction happens more quickly than  new bone creation. After menopause, you may be at an increased risk for osteoporosis. To help prevent osteoporosis or the bone fractures that can happen because of osteoporosis, the following is recommended:  If you are 29-6 years old, get at least 1,000 mg of calcium and at least 600 mg of vitamin D per day.  If you are older than age 64 but younger than age 36, get at least 1,200 mg of calcium and at least 600 mg of vitamin D per day.  If you are older than age 66, get at least 1,200 mg of calcium and at least 800 mg of vitamin D per day.  Smoking and excessive alcohol intake increase the risk of osteoporosis. Eat foods that are rich in calcium and vitamin D, and do weight-bearing exercises several times each week as directed by your health care provider. What should I know about how menopause affects my mental health? Depression may occur at any age, but it is more common as you become older. Common symptoms of depression include:  Low or sad mood.  Changes in sleep patterns.  Changes in appetite or eating patterns.  Feeling an overall lack of motivation or enjoyment of activities that you previously enjoyed.  Frequent crying spells.  Talk with your health care provider if you think that you are experiencing depression. What should I know about immunizations? It is important that you get and maintain your immunizations. These include:  Tetanus, diphtheria, and pertussis (Tdap) booster vaccine.  Influenza every year before the flu  season begins.  Pneumonia vaccine.  Shingles vaccine.  Your health care provider may also recommend other immunizations. This information is not intended to replace advice given to you by your health care provider. Make sure you discuss any questions you have with your health care provider. Document Released: 08/16/2005 Document Revised: 01/12/2016 Document Reviewed: 03/28/2015 Elsevier Interactive Patient Education  2018 [employer]  

## 2017-12-03 NOTE — Progress Notes (Signed)
[name] [birth year] [id number]    History:    Presents for annual exam.  TAH with BSO on for endometriosis on Vivelle patch with continued hot flashes.  Also on Effexor.  Normal Pap and mammogram history.  Primary care manages hypertension, depression and hypothyroidism.  Zostavac in 2017.  Has not  had a screening colonoscopy.  Biggest problem is degenerative disc disease causing chronic back pain and left hip pain.  Past medical history, past surgical history, family history and social history were all reviewed and documented in the EPIC chart.  Retired.  Helps care for daughters  2  daughters that are ages 2 and 3-1/2.  Same partner.  ROS:  A ROS was performed and pertinent positives and negatives are included.  Exam:  Vitals:   12/03/17 1005  BP: 122/80  Weight: 195 lb (88.5 kg)  Height:  (1.549 m)   Body mass index is 36.84 kg/m.   General appearance:  Normal Thyroid:  Symmetrical, normal in size, without palpable masses or nodularity. Respiratory  Auscultation:  Clear without wheezing or rhonchi Cardiovascular  Auscultation:  Regular rate, without rubs, murmurs or gallops  Edema/varicosities:  Not grossly evident Abdominal  Soft,nontender, without masses, guarding or rebound.  Liver/spleen:  No organomegaly noted  Hernia:  None appreciated  Skin  Inspection:  Grossly normal   Breasts: Examined lying and sitting.     Right: Without masses, retractions, discharge or axillary adenopathy.     Left: Without masses, retractions, discharge or axillary adenopathy. Gentitourinary   Inguinal/mons:  Normal without inguinal adenopathy  External genitalia:  Normal  BUS/Urethra/Skene's glands:  Normal  Vagina:  Normal  Cervix: And uterus absent adnexa/parametria:     Rt: Without masses or tenderness.   Lt: Without masses or tenderness.  Anus and perineum: Normal  Digital rectal exam: Normal sphincter tone without palpated masses or tenderness  Assessment/Plan:  62  y.o. engaged WF G1, P1 for annual exam.     TAH with BSO for endometriosis on HRT Chronic left hip and back pain Hypertension, depression, hypothyroidism-primary care manages labs and meds Obesity  Plan: Colonoscopy discussed, reviewed importance of, [facility] information given instructed to schedule.  HRT reviewed risk for blood clots, strokes and breast cancer, accepts, Vivelle 0.1 patch twice weekly prescription, proper use given and reviewed.  SBE's, continue annual screening mammogram, calcium rich foods, vitamin D 2000 daily encouraged.  Schedule DEXA, history of normal DEXA in the past.  Safety, fall prevention and importance of weightbearing exercise as able given.    Harrington Challenger [facility], 10:41 AM 12/03/2017

## 2017-12-08 ENCOUNTER — Other Ambulatory Visit: Payer: Self-pay | Admitting: Gynecology

## 2017-12-08 DIAGNOSIS — Z13820 Encounter for screening for osteoporosis: Secondary | ICD-10-CM

## 2017-12-16 ENCOUNTER — Other Ambulatory Visit: Payer: Self-pay | Admitting: Women's Health

## 2017-12-16 DIAGNOSIS — Z79890 Hormone replacement therapy (postmenopausal): Secondary | ICD-10-CM

## 2017-12-19 DIAGNOSIS — N3010 Interstitial cystitis (chronic) without hematuria: Secondary | ICD-10-CM | POA: Diagnosis not present

## 2018-01-30 DIAGNOSIS — N3010 Interstitial cystitis (chronic) without hematuria: Secondary | ICD-10-CM | POA: Diagnosis not present

## 2018-02-19 DIAGNOSIS — E039 Hypothyroidism, unspecified: Secondary | ICD-10-CM | POA: Diagnosis not present

## 2018-02-19 DIAGNOSIS — E559 Vitamin D deficiency, unspecified: Secondary | ICD-10-CM | POA: Diagnosis not present

## 2018-02-19 DIAGNOSIS — I10 Essential (primary) hypertension: Secondary | ICD-10-CM | POA: Diagnosis not present

## 2018-03-13 DIAGNOSIS — N3010 Interstitial cystitis (chronic) without hematuria: Secondary | ICD-10-CM | POA: Diagnosis not present

## 2018-03-20 DIAGNOSIS — M545 Low back pain: Secondary | ICD-10-CM | POA: Diagnosis not present

## 2018-03-20 DIAGNOSIS — M1612 Unilateral primary osteoarthritis, left hip: Secondary | ICD-10-CM | POA: Diagnosis not present

## 2018-03-20 DIAGNOSIS — M5136 Other intervertebral disc degeneration, lumbar region: Secondary | ICD-10-CM | POA: Diagnosis not present

## 2018-03-20 DIAGNOSIS — M4316 Spondylolisthesis, lumbar region: Secondary | ICD-10-CM | POA: Diagnosis not present

## 2018-04-03 DIAGNOSIS — M5136 Other intervertebral disc degeneration, lumbar region: Secondary | ICD-10-CM | POA: Diagnosis not present

## 2018-04-07 ENCOUNTER — Other Ambulatory Visit: Payer: Self-pay | Admitting: Obstetrics & Gynecology

## 2018-04-07 ENCOUNTER — Ambulatory Visit (INDEPENDENT_AMBULATORY_CARE_PROVIDER_SITE_OTHER): Payer: BLUE CROSS/BLUE SHIELD

## 2018-04-07 DIAGNOSIS — Z13820 Encounter for screening for osteoporosis: Secondary | ICD-10-CM

## 2018-04-10 DIAGNOSIS — M545 Low back pain: Secondary | ICD-10-CM | POA: Diagnosis not present

## 2018-04-10 DIAGNOSIS — M5136 Other intervertebral disc degeneration, lumbar region: Secondary | ICD-10-CM | POA: Diagnosis not present

## 2018-04-10 DIAGNOSIS — M4316 Spondylolisthesis, lumbar region: Secondary | ICD-10-CM | POA: Diagnosis not present

## 2018-04-10 DIAGNOSIS — Z6841 Body Mass Index (BMI) 40.0 and over, adult: Secondary | ICD-10-CM | POA: Diagnosis not present

## 2018-04-21 ENCOUNTER — Ambulatory Visit: Payer: Self-pay | Admitting: Orthopedic Surgery

## 2018-04-24 DIAGNOSIS — N3010 Interstitial cystitis (chronic) without hematuria: Secondary | ICD-10-CM | POA: Diagnosis not present

## 2018-04-28 ENCOUNTER — Ambulatory Visit: Payer: Self-pay | Admitting: Orthopedic Surgery

## 2018-04-28 NOTE — H&P (Signed)
[name] is an 62 y.o. female.   Chief Complaint: back and leg pain HPI: Reason for Visit: (normal) review of test results (lumbar MRI); The patient is 7 1/2 months out from when symptoms began. Location (Lower Extremity): lower back pain ; left buttock and knee Severity: pain level 5/10 Timing: come and go Medications: helping a little; The patient is taking Tizanidine.  Past Medical History:  Diagnosis Date  . Ankle fracture, right   . ASCUS (atypical squamous cells of undetermined significance) on Pap smear 07/2005   NEG FOR HIGH RISK HPV  . Depression   . Endometriosis   . Hypertension   . Hypothyroid   . IC (interstitial cystitis)   . Menopausal state     Past Surgical History:  Procedure Laterality Date  . ABDOMINAL HYSTERECTOMY     TAH,LSO  . BLADDER SUSPENSION  2005   X2  . CARPAL TUNNEL RELEASE  2011, 2012  . OOPHORECTOMY     LSO WITH TAH  . PELVIC LAPAROSCOPY    . right broken ankle repair  2014  . TOE SURGERY  05/2009   PIN IN OUTER TOE RIGHT FOOT    Family History  Problem Relation Age of Onset  . Hypertension Mother   . Breast cancer Mother        5'S   Social History:  reports that she has never smoked. She has never used smokeless tobacco. She reports that she does not drink alcohol or use drugs.  Allergies:  Allergies  Allergen Reactions  . Amoxicillin-Pot Clavulanate Nausea And Vomiting  . Bactrim Nausea And Vomiting  . Codeine Rash  . Morphine And Related Rash   Medications: atorvastatin 20 mg tablet Elmiron 100 mg capsule estradiol 0.1 mg/24 hr semiweekly transdermal patch fluconazole 150 mg tablet hydrOXYzine HCl 25 mg tablet hyoscyamine 0.125 mg sublingual tablet levothyroxine 75 mcg tablet LORazepam 1 mg tablet Restasis 0.05 % eye drops in a dropperette tiZANidine 4 mg tablet triamterene 37.5 mg-hydrochlorothiazide 25 mg tablet venlafaxine ER 150 mg capsule,extended release 24 hr  Review of Systems  Constitutional:  Negative.   HENT: Negative.   Eyes: Negative.   Respiratory: Negative.   Cardiovascular: Negative.   Gastrointestinal: Negative.   Genitourinary: Negative.   Musculoskeletal: Positive for back pain.  Skin: Negative.   Neurological: Positive for sensory change and focal weakness.  Psychiatric/Behavioral: Negative.     Last menstrual period 05/05/1985. Physical Exam  Constitutional: She is oriented to person, place, and time. She appears well-developed.  HENT:  Head: Normocephalic.  Eyes: Pupils are equal, round, and reactive to light.  Neck: Normal range of motion.  Cardiovascular: Normal rate.  Respiratory: Effort normal.  GI: Soft.  Musculoskeletal:  Patient is a 62 year old female.  Gait and Station: Appearance: ambulating with no assistive devices and antalgic gait.  Constitutional: General Appearance: healthy-appearing and distress (mild).  Psychiatric: Mood and Affect: active and alert.  Cardiovascular System: Edema Right: none; Dorsalis and posterior tibial pulses 2+. Edema Left: none.  Abdomen: Inspection and Palpation: non-distended and no tenderness.  Skin: Inspection and palpation: no rash.  Lumbar Spine: Inspection: normal alignment. Bony Palpation of the Lumbar Spine: tender at lumbosacral junction.. Bony Palpation of the Right Hip: no tenderness of the greater trochanter and tenderness of the SI joint; Pelvis stable. Bony Palpation of the Left Hip: no tenderness of the greater trochanter and tenderness of the SI joint. Soft Tissue Palpation on the Right: No flank pain with percussion. Active Range of Motion:  limited flexion and extention.  Motor Strength: L1 Motor Strength on the Right: hip flexion iliopsoas 5/5. L1 Motor Strength on the Left: hip flexion iliopsoas 5/5. L2-L4 Motor Strength on the Right: knee extension quadriceps 5/5. L2-L4 Motor Strength on the Left: knee extension quadriceps 5/5. L5 Motor Strength on the Right: ankle dorsiflexion tibialis  anterior 5/5 and great toe extension extensor hallucis longus 4/5. L5 Motor Strength on the Left: ankle dorsiflexion tibialis anterior 5/5 and great toe extension extensor hallucis longus 4/5. S1 Motor Strength on the Right: plantar flexion gastrocnemius 5/5. S1 Motor Strength on the Left: plantar flexion gastrocnemius 5/5.  Neurological System: Knee Reflex Right: normal (2). Knee Reflex Left: normal (2). Ankle Reflex Right: normal (2). Ankle Reflex Left: normal (2). Babinski Reflex Right: plantar reflex absent. Babinski Reflex Left: plantar reflex absent. Sensation on the Right: normal distal extremities. Sensation on the Left: normal distal extremities. Special Tests on the Right: no clonus of the ankle/knee and seated straight leg raising test positive. Special Tests on the Left: no clonus of the ankle/knee and seated straight leg raising test positive.  Normal cervical lordosis. No pain with range of motion. No palpable tenderness. Motor is 5/5 in all groups in the upper extremities. Upper extremity sensory exam normal. Patient is normoreflexic in the upper extremities. No Hoffmann sign.  Neurological: She is alert and oriented to person, place, and time.  Skin: Skin is warm and dry.    MRI demonstrates severe central stenosis at L4-5 with a small facet cyst on the left.  Mild to moderate stenosis at L3-4.  Assessment/Plan Patient is neurogenic claudication secondary spinal stenosis and radicular pain bilaterally. She has minimal back pain. We discussed certain options including living with her symptoms versus  Epidural steroid injections versus operative options. She has a pending bone density study. She does appear to be osteopenic at minimum.  She has had these symptoms for an extended period of time. She does have EHL weakness bilaterally. Meaning a neurologic deficit  We discussed a decompression at L4-5 and possibly at L3-4 centrally. She would like to proceed with that as this is been  of ongoing duration.  I had an extensive discussion with the patient concerning the pathology relevant anatomy and treatment options. At this point exhausting conservative treatment and in the presence of a neurologic deficit we discussed microlumbar decompression. I discussed the risks and benefits including bleeding, infection, DVT, PE, anesthetic complications, worsening in their symptoms, improvement in their symptoms, C SF leakage, epidural fibrosis, need for future surgeries such as revision discectomy and lumbar fusion. I also indicated that this is an operation to basically decompress the nerve root to allow recovery as opposed to fixing a herniated disc and that the incidence of recurrent chest disc herniation can approach 15%. Also that nerve root recovery is variable and may not recover completely.  I discussed the operative course including overnight in the hospital. Immediate ambulation. Follow-up in 2 weeks for suture removal. 6 weeks until healing of the herniation followed by 6 weeks of reconditioning and strengthening of the core musculature. Also discussed the need to employ the concepts of disc pressure management and core motion following the surgery to minimize the risk of recurrent disc herniation. We will obtain preoperative clearance i if necessary and proceed accordingly.  She is to forward her bone density study.  We discussed the possibility of an instrumented fusion in the future. She is a slight offset at L4-5 it is mostly rotational from her  scoliosis. We discussed possibility of a lateral mass fusion as well with autograft and allograft bone graft.  Plan microlumbar decompression L4-5, possible L3-4, removal synovial cyst, possible lateral mass fusion  BISSELL, Dayna Barker., PA-C for Dr. Shelle Iron 04/28/2018, 3:02 PM

## 2018-04-28 NOTE — H&P (View-Only) (Signed)
[name] is an 62 y.o. female.   Chief Complaint: back and leg pain HPI: Reason for Visit: (normal) review of test results (lumbar MRI); The patient is 7 1/2 months out from when symptoms began. Location (Lower Extremity): lower back pain ; left buttock and knee Severity: pain level 5/10 Timing: come and go Medications: helping a little; The patient is taking Tizanidine.  Past Medical History:  Diagnosis Date  . Ankle fracture, right   . ASCUS (atypical squamous cells of undetermined significance) on Pap smear 07/2005   NEG FOR HIGH RISK HPV  . Depression   . Endometriosis   . Hypertension   . Hypothyroid   . IC (interstitial cystitis)   . Menopausal state     Past Surgical History:  Procedure Laterality Date  . ABDOMINAL HYSTERECTOMY     TAH,LSO  . BLADDER SUSPENSION  2005   X2  . CARPAL TUNNEL RELEASE  2011, 2012  . OOPHORECTOMY     LSO WITH TAH  . PELVIC LAPAROSCOPY    . right broken ankle repair  2014  . TOE SURGERY  05/2009   PIN IN OUTER TOE RIGHT FOOT    Family History  Problem Relation Age of Onset  . Hypertension Mother   . Breast cancer Mother        70'S   Social History:  reports that she has never smoked. She has never used smokeless tobacco. She reports that she does not drink alcohol or use drugs.  Allergies:  Allergies  Allergen Reactions  . Amoxicillin-Pot Clavulanate Nausea And Vomiting  . Bactrim Nausea And Vomiting  . Codeine Rash  . Morphine And Related Rash   Medications: atorvastatin 20 mg tablet Elmiron 100 mg capsule estradiol 0.1 mg/24 hr semiweekly transdermal patch fluconazole 150 mg tablet hydrOXYzine HCl 25 mg tablet hyoscyamine 0.125 mg sublingual tablet levothyroxine 75 mcg tablet LORazepam 1 mg tablet Restasis 0.05 % eye drops in a dropperette tiZANidine 4 mg tablet triamterene 37.5 mg-hydrochlorothiazide 25 mg tablet venlafaxine ER 150 mg capsule,extended release 24 hr  Review of Systems  Constitutional:  Negative.   HENT: Negative.   Eyes: Negative.   Respiratory: Negative.   Cardiovascular: Negative.   Gastrointestinal: Negative.   Genitourinary: Negative.   Musculoskeletal: Positive for back pain.  Skin: Negative.   Neurological: Positive for sensory change and focal weakness.  Psychiatric/Behavioral: Negative.     Last menstrual period 05/05/1985. Physical Exam  Constitutional: She is oriented to person, place, and time. She appears well-developed.  HENT:  Head: Normocephalic.  Eyes: Pupils are equal, round, and reactive to light.  Neck: Normal range of motion.  Cardiovascular: Normal rate.  Respiratory: Effort normal.  GI: Soft.  Musculoskeletal:  Patient is a 62-year-old female.  Gait and Station: Appearance: ambulating with no assistive devices and antalgic gait.  Constitutional: General Appearance: healthy-appearing and distress (mild).  Psychiatric: Mood and Affect: active and alert.  Cardiovascular System: Edema Right: none; Dorsalis and posterior tibial pulses 2+. Edema Left: none.  Abdomen: Inspection and Palpation: non-distended and no tenderness.  Skin: Inspection and palpation: no rash.  Lumbar Spine: Inspection: normal alignment. Bony Palpation of the Lumbar Spine: tender at lumbosacral junction.. Bony Palpation of the Right Hip: no tenderness of the greater trochanter and tenderness of the SI joint; Pelvis stable. Bony Palpation of the Left Hip: no tenderness of the greater trochanter and tenderness of the SI joint. Soft Tissue Palpation on the Right: No flank pain with percussion. Active Range of Motion:   limited flexion and extention.  Motor Strength: L1 Motor Strength on the Right: hip flexion iliopsoas 5/5. L1 Motor Strength on the Left: hip flexion iliopsoas 5/5. L2-L4 Motor Strength on the Right: knee extension quadriceps 5/5. L2-L4 Motor Strength on the Left: knee extension quadriceps 5/5. L5 Motor Strength on the Right: ankle dorsiflexion tibialis  anterior 5/5 and great toe extension extensor hallucis longus 4/5. L5 Motor Strength on the Left: ankle dorsiflexion tibialis anterior 5/5 and great toe extension extensor hallucis longus 4/5. S1 Motor Strength on the Right: plantar flexion gastrocnemius 5/5. S1 Motor Strength on the Left: plantar flexion gastrocnemius 5/5.  Neurological System: Knee Reflex Right: normal (2). Knee Reflex Left: normal (2). Ankle Reflex Right: normal (2). Ankle Reflex Left: normal (2). Babinski Reflex Right: plantar reflex absent. Babinski Reflex Left: plantar reflex absent. Sensation on the Right: normal distal extremities. Sensation on the Left: normal distal extremities. Special Tests on the Right: no clonus of the ankle/knee and seated straight leg raising test positive. Special Tests on the Left: no clonus of the ankle/knee and seated straight leg raising test positive.  Normal cervical lordosis. No pain with range of motion. No palpable tenderness. Motor is 5/5 in all groups in the upper extremities. Upper extremity sensory exam normal. Patient is normoreflexic in the upper extremities. No Hoffmann sign.  Neurological: She is alert and oriented to person, place, and time.  Skin: Skin is warm and dry.    MRI demonstrates severe central stenosis at L4-5 with a small facet cyst on the left.  Mild to moderate stenosis at L3-4.  Assessment/Plan Patient is neurogenic claudication secondary spinal stenosis and radicular pain bilaterally. She has minimal back pain. We discussed certain options including living with her symptoms versus  Epidural steroid injections versus operative options. She has a pending bone density study. She does appear to be osteopenic at minimum.  She has had these symptoms for an extended period of time. She does have EHL weakness bilaterally. Meaning a neurologic deficit  We discussed a decompression at L4-5 and possibly at L3-4 centrally. She would like to proceed with that as this is been  of ongoing duration.  I had an extensive discussion with the patient concerning the pathology relevant anatomy and treatment options. At this point exhausting conservative treatment and in the presence of a neurologic deficit we discussed microlumbar decompression. I discussed the risks and benefits including bleeding, infection, DVT, PE, anesthetic complications, worsening in their symptoms, improvement in their symptoms, C SF leakage, epidural fibrosis, need for future surgeries such as revision discectomy and lumbar fusion. I also indicated that this is an operation to basically decompress the nerve root to allow recovery as opposed to fixing a herniated disc and that the incidence of recurrent chest disc herniation can approach 15%. Also that nerve root recovery is variable and may not recover completely.  I discussed the operative course including overnight in the hospital. Immediate ambulation. Follow-up in 2 weeks for suture removal. 6 weeks until healing of the herniation followed by 6 weeks of reconditioning and strengthening of the core musculature. Also discussed the need to employ the concepts of disc pressure management and core motion following the surgery to minimize the risk of recurrent disc herniation. We will obtain preoperative clearance i if necessary and proceed accordingly.  She is to forward her bone density study.  We discussed the possibility of an instrumented fusion in the future. She is a slight offset at L4-5 it is mostly rotational from her   scoliosis. We discussed possibility of a lateral mass fusion as well with autograft and allograft bone graft.  Plan microlumbar decompression L4-5, possible L3-4, removal synovial cyst, possible lateral mass fusion  Jevon Shells M., PA-C for Dr. Beane 04/28/2018, 3:02 PM   

## 2018-05-07 ENCOUNTER — Encounter (HOSPITAL_COMMUNITY)
Admission: RE | Admit: 2018-05-07 | Discharge: 2018-05-07 | Disposition: A | Discharge status: Home / Self Care | Payer: BLUE CROSS/BLUE SHIELD | Source: Ambulatory Visit | Attending: Specialist | Admitting: Specialist

## 2018-05-07 ENCOUNTER — Other Ambulatory Visit: Payer: Self-pay

## 2018-05-07 ENCOUNTER — Encounter (HOSPITAL_COMMUNITY): Payer: Self-pay

## 2018-05-07 ENCOUNTER — Ambulatory Visit (HOSPITAL_COMMUNITY)
Admission: RE | Admit: 2018-05-07 | Discharge: 2018-05-07 | Disposition: A | Discharge status: Home / Self Care | Payer: BLUE CROSS/BLUE SHIELD | Source: Ambulatory Visit | Attending: Orthopedic Surgery | Admitting: Orthopedic Surgery

## 2018-05-07 DIAGNOSIS — M48061 Spinal stenosis, lumbar region without neurogenic claudication: Secondary | ICD-10-CM

## 2018-05-07 DIAGNOSIS — M47817 Spondylosis without myelopathy or radiculopathy, lumbosacral region: Secondary | ICD-10-CM | POA: Insufficient documentation

## 2018-05-07 DIAGNOSIS — I517 Cardiomegaly: Secondary | ICD-10-CM | POA: Diagnosis not present

## 2018-05-07 DIAGNOSIS — Z01818 Encounter for other preprocedural examination: Secondary | ICD-10-CM | POA: Insufficient documentation

## 2018-05-07 DIAGNOSIS — I10 Essential (primary) hypertension: Secondary | ICD-10-CM | POA: Insufficient documentation

## 2018-05-07 DIAGNOSIS — M47816 Spondylosis without myelopathy or radiculopathy, lumbar region: Secondary | ICD-10-CM | POA: Diagnosis not present

## 2018-05-07 HISTORY — DX: Nausea with vomiting, unspecified: R11.2

## 2018-05-07 HISTORY — DX: Adverse effect of unspecified anesthetic, initial encounter: T41.45XA

## 2018-05-07 HISTORY — DX: Other specified postprocedural states: Z98.890

## 2018-05-07 HISTORY — DX: Other complications of anesthesia, initial encounter: T88.59XA

## 2018-05-07 HISTORY — DX: Unspecified osteoarthritis, unspecified site: M19.90

## 2018-05-07 HISTORY — DX: Fibromyalgia: M79.7

## 2018-05-07 LAB — CBC
HCT: 47.1 % — ABNORMAL HIGH (ref 36.0–46.0)
Hemoglobin: 15.6 g/dL — ABNORMAL HIGH (ref 12.0–15.0)
MCH: 29.5 pg (ref 26.0–34.0)
MCHC: 33.1 g/dL (ref 30.0–36.0)
MCV: 89.2 fL (ref 80.0–100.0)
NRBC: 0 % (ref 0.0–0.2)
PLATELETS: 357 10*3/uL (ref 150–400)
RBC: 5.28 MIL/uL — ABNORMAL HIGH (ref 3.87–5.11)
RDW: 13.3 % (ref 11.5–15.5)
WBC: 9.1 10*3/uL (ref 4.0–10.5)

## 2018-05-07 LAB — BASIC METABOLIC PANEL
Anion gap: 8 (ref 5–15)
BUN: 13 mg/dL (ref 8–23)
CALCIUM: 8.9 mg/dL (ref 8.9–10.3)
CO2: 25 mmol/L (ref 22–32)
CREATININE: 0.83 mg/dL (ref 0.44–1.00)
Chloride: 103 mmol/L (ref 98–111)
GFR calc non Af Amer: >60 mL/min (ref >60)
Glucose, Bld: 104 mg/dL — ABNORMAL HIGH (ref 70–99)
Potassium: 3 mmol/L — ABNORMAL LOW (ref 3.5–5.1)
SODIUM: 136 mmol/L (ref 135–145)

## 2018-05-07 LAB — ABO/RH: ABO/RH(D): A NEG

## 2018-05-07 LAB — TYPE AND SCREEN
ABO/RH(D): A NEG
ANTIBODY SCREEN: NEGATIVE

## 2018-05-07 NOTE — Pre-Procedure Instructions (Addendum)
SHONNIE POUDRIER  05/07/2018      [id number] - [place], [place] - [address] [place] [address] Phone: [phone] Fax: [phone]    Your procedure is scheduled on Thursday November 7th.  Report to [facility] Admitting at 0530 A.M.  Call this number if you have problems the morning of surgery:  [phone]   Remember:  Do not eat or drink after midnight.    Take these medicines the morning of surgery with A SIP OF WATER    levothyroxine (SYNTHROID, LEVOTHROID)   tiZANidine (ZANAFLEX)  hydrOXYzine (ATARAX/VISTARIL)  7 days prior to surgery STOP taking any Aspirin(unless otherwise instructed by your surgeon), Aleve, Naproxen, Ibuprofen, Motrin, Advil, Goody's, BC's, all herbal medications, fish oil, and all vitamins   Do not wear jewelry, make-up or nail polish.  Do not wear lotions, powders, or perfumes, or deodorant.  Do not shave 48 hours prior to surgery.  Men may shave face and neck.  Do not bring valuables to the hospital.  [facility] is not responsible for any belongings or valuables.  Contacts, dentures or bridgework may not be worn into surgery.  Leave your suitcase in the car.  After surgery it may be brought to your room.  For patients admitted to the hospital, discharge time will be determined by your treatment team.  Patients discharged the day of surgery will not be allowed to drive home.    [facility]- Preparing For Surgery  Before surgery, you can play an important role. Because skin is not sterile, your skin needs to be as free of germs as possible. You can reduce the number of germs on your skin by washing with CHG (chlorahexidine gluconate) Soap before surgery.  CHG is an antiseptic cleaner which kills germs and bonds with the skin to continue killing germs even after washing.    Oral Hygiene is also important to reduce your risk of infection.  Remember - BRUSH YOUR TEETH THE MORNING OF SURGERY WITH YOUR  REGULAR TOOTHPASTE  Please do not use if you have an allergy to CHG or antibacterial soaps. If your skin becomes reddened/irritated stop using the CHG.  Do not shave (including legs and underarms) for at least 48 hours prior to first CHG shower. It is OK to shave your face.  Please follow these instructions carefully.   1. Shower the NIGHT BEFORE SURGERY and the MORNING OF SURGERY with CHG.   2. If you chose to wash your hair, wash your hair first as usual with your normal shampoo.  3. After you shampoo, rinse your hair and body thoroughly to remove the shampoo.  4. Use CHG as you would any other liquid soap. You can apply CHG directly to the skin and wash gently with a scrungie or a clean washcloth.   5. Apply the CHG Soap to your body ONLY FROM THE NECK DOWN.  Do not use on open wounds or open sores. Avoid contact with your eyes, ears, mouth and genitals (private parts). Wash Face and genitals (private parts)  with your normal soap.  6. Wash thoroughly, paying special attention to the area where your surgery will be performed.  7. Thoroughly rinse your body with warm water from the neck down.  8. DO NOT shower/wash with your normal soap after using and rinsing off the CHG Soap.  9. Pat yourself dry with a CLEAN TOWEL.  10. Wear CLEAN PAJAMAS to bed the night before surgery, wear comfortable  clothes the morning of surgery  11. Place CLEAN SHEETS on your bed the night of your first shower and DO NOT SLEEP WITH PETS.    Day of Surgery:  Do not apply any deodorants/lotions.  Please wear clean clothes to the hospital/surgery center.   Remember to brush your teeth WITH YOUR REGULAR TOOTHPASTE.    Please read over the following fact sheets that you were given.

## 2018-05-07 NOTE — Progress Notes (Signed)
PCR swab sent down. Order placed shortly after PCR sent. Screen pending saying need sample. Called lab, techs said they couldn't find or didn't have sample. If no sample resulted, betadine DOS

## 2018-05-07 NOTE — Progress Notes (Signed)
PCP - Jarrett Soho PA  Lumbar x-ray - 05/07/18 EKG - 05/07/18  Blood Thinner Instructions: N/A Aspirin Instructions: N/A   Anesthesia review: none  Patient denies shortness of breath, fever, cough and chest pain at PAT appointment   Patient verbalized understanding of instructions that were given to them at the PAT appointment. Patient was also instructed that they will need to review over the PAT instructions again at home before surgery.

## 2018-05-13 NOTE — Anesthesia Preprocedure Evaluation (Addendum)
Anesthesia Evaluation  Patient identified by MRN, date of birth, ID band Patient awake    Reviewed: Allergy & Precautions, NPO status , Patient's Chart, lab work & pertinent test results  History of Anesthesia Complications (+) PONV and history of anesthetic complications  Airway Mallampati: I  TM Distance: >3 FB Neck ROM: Full    Dental  (+) Teeth Intact, Dental Advisory Given   Pulmonary neg pulmonary ROS,    breath sounds clear to auscultation       Cardiovascular hypertension, Pt. on medications  Rhythm:Regular Rate:Normal     Neuro/Psych Depression negative neurological ROS     GI/Hepatic negative GI ROS, Neg liver ROS,   Endo/Other  Hypothyroidism Obese (BMI 34.3)  Renal/GU negative Renal ROS     Musculoskeletal  (+) Arthritis , Osteoarthritis,  Fibromyalgia -  Abdominal   Peds  Hematology negative hematology ROS (+)   Anesthesia Other Findings   Reproductive/Obstetrics negative OB ROS                           Anesthesia Physical Anesthesia Plan  ASA: II  Anesthesia Plan: General   Post-op Pain Management:    Induction: Intravenous  PONV Risk Score and Plan: 4 or greater and Ondansetron, Dexamethasone, Treatment may vary due to age or medical condition, Midazolam and Scopolamine patch - Pre-op  Airway Management Planned: Oral ETT  Additional Equipment:   Intra-op Plan:   Post-operative Plan: Extubation in OR  Informed Consent: I have reviewed the patients History and Physical, chart, labs and discussed the procedure including the risks, benefits and alternatives for the proposed anesthesia with the patient or authorized representative who has indicated his/her understanding and acceptance.   Dental advisory given  Plan Discussed with: CRNA, Anesthesiologist and Surgeon  Anesthesia Plan Comments:       Anesthesia Quick Evaluation

## 2018-05-14 ENCOUNTER — Ambulatory Visit (HOSPITAL_COMMUNITY): Payer: BLUE CROSS/BLUE SHIELD | Admitting: Anesthesiology

## 2018-05-14 ENCOUNTER — Ambulatory Visit (HOSPITAL_COMMUNITY): Payer: BLUE CROSS/BLUE SHIELD

## 2018-05-14 ENCOUNTER — Ambulatory Visit (HOSPITAL_COMMUNITY)
Admission: RE | Disposition: A | Discharge status: Home / Self Care | Payer: Self-pay | Source: Ambulatory Visit | Attending: Specialist

## 2018-05-14 ENCOUNTER — Other Ambulatory Visit: Payer: Self-pay

## 2018-05-14 ENCOUNTER — Ambulatory Visit (HOSPITAL_COMMUNITY)
Admission: RE | Admit: 2018-05-14 | Discharge: 2018-05-15 | Disposition: A | Discharge status: Home / Self Care | Payer: BLUE CROSS/BLUE SHIELD | Source: Ambulatory Visit | Attending: Specialist | Admitting: Specialist

## 2018-05-14 ENCOUNTER — Encounter (HOSPITAL_COMMUNITY): Payer: Self-pay

## 2018-05-14 DIAGNOSIS — M48062 Spinal stenosis, lumbar region with neurogenic claudication: Secondary | ICD-10-CM | POA: Diagnosis not present

## 2018-05-14 DIAGNOSIS — M797 Fibromyalgia: Secondary | ICD-10-CM | POA: Insufficient documentation

## 2018-05-14 DIAGNOSIS — Z880 Allergy status to penicillin: Secondary | ICD-10-CM | POA: Diagnosis not present

## 2018-05-14 DIAGNOSIS — Z90710 Acquired absence of both cervix and uterus: Secondary | ICD-10-CM | POA: Insufficient documentation

## 2018-05-14 DIAGNOSIS — N3010 Interstitial cystitis (chronic) without hematuria: Secondary | ICD-10-CM | POA: Insufficient documentation

## 2018-05-14 DIAGNOSIS — M5416 Radiculopathy, lumbar region: Secondary | ICD-10-CM | POA: Insufficient documentation

## 2018-05-14 DIAGNOSIS — F329 Major depressive disorder, single episode, unspecified: Secondary | ICD-10-CM | POA: Diagnosis not present

## 2018-05-14 DIAGNOSIS — Z885 Allergy status to narcotic agent status: Secondary | ICD-10-CM | POA: Insufficient documentation

## 2018-05-14 DIAGNOSIS — E669 Obesity, unspecified: Secondary | ICD-10-CM | POA: Insufficient documentation

## 2018-05-14 DIAGNOSIS — E039 Hypothyroidism, unspecified: Secondary | ICD-10-CM | POA: Diagnosis not present

## 2018-05-14 DIAGNOSIS — I10 Essential (primary) hypertension: Secondary | ICD-10-CM | POA: Diagnosis not present

## 2018-05-14 DIAGNOSIS — M419 Scoliosis, unspecified: Secondary | ICD-10-CM | POA: Diagnosis not present

## 2018-05-14 DIAGNOSIS — M7138 Other bursal cyst, other site: Secondary | ICD-10-CM | POA: Insufficient documentation

## 2018-05-14 DIAGNOSIS — M48061 Spinal stenosis, lumbar region without neurogenic claudication: Secondary | ICD-10-CM | POA: Diagnosis not present

## 2018-05-14 DIAGNOSIS — M1991 Primary osteoarthritis, unspecified site: Secondary | ICD-10-CM | POA: Diagnosis not present

## 2018-05-14 DIAGNOSIS — M4316 Spondylolisthesis, lumbar region: Secondary | ICD-10-CM | POA: Diagnosis not present

## 2018-05-14 DIAGNOSIS — Z6834 Body mass index (BMI) 34.0-34.9, adult: Secondary | ICD-10-CM | POA: Diagnosis not present

## 2018-05-14 DIAGNOSIS — Z981 Arthrodesis status: Secondary | ICD-10-CM | POA: Diagnosis not present

## 2018-05-14 DIAGNOSIS — Z79899 Other long term (current) drug therapy: Secondary | ICD-10-CM | POA: Insufficient documentation

## 2018-05-14 DIAGNOSIS — M6788 Other specified disorders of synovium and tendon, other site: Secondary | ICD-10-CM | POA: Diagnosis not present

## 2018-05-14 DIAGNOSIS — Z419 Encounter for procedure for purposes other than remedying health state, unspecified: Secondary | ICD-10-CM

## 2018-05-14 HISTORY — PX: LUMBAR LAMINECTOMY/DECOMPRESSION MICRODISCECTOMY: SHX5026

## 2018-05-14 SURGERY — LUMBAR LAMINECTOMY/DECOMPRESSION MICRODISCECTOMY 2 LEVELS
Anesthesia: General | Site: Spine Lumbar

## 2018-05-14 MED ORDER — ONDANSETRON HCL 4 MG/2ML IJ SOLN
INTRAMUSCULAR | Status: AC
  Filled 2018-05-14: qty 2

## 2018-05-14 MED ORDER — DEXAMETHASONE SODIUM PHOSPHATE 10 MG/ML IJ SOLN
INTRAMUSCULAR | Status: DC | PRN
  Administered 2018-05-14: 10 mg via INTRAVENOUS

## 2018-05-14 MED ORDER — ROCURONIUM BROMIDE 10 MG/ML (PF) SYRINGE
PREFILLED_SYRINGE | INTRAVENOUS | Status: DC | PRN
  Administered 2018-05-14 (×2): 10 mg via INTRAVENOUS
  Administered 2018-05-14: 50 mg via INTRAVENOUS

## 2018-05-14 MED ORDER — HYDROXYZINE HCL 25 MG PO TABS
25.0000 mg | ORAL_TABLET | Freq: Three times a day (TID) | ORAL | Status: DC | PRN
  Administered 2018-05-15: 25 mg via ORAL
  Filled 2018-05-14: qty 1

## 2018-05-14 MED ORDER — 0.9 % SODIUM CHLORIDE (POUR BTL) OPTIME
TOPICAL | Status: DC | PRN
  Administered 2018-05-14: 1000 mL

## 2018-05-14 MED ORDER — METHOCARBAMOL 500 MG PO TABS: 500.0000 mg | ORAL_TABLET | Freq: Four times a day (QID) | ORAL | 1 refills | Status: DC | PRN

## 2018-05-14 MED ORDER — LEVOTHYROXINE SODIUM 75 MCG PO TABS
75.0000 ug | ORAL_TABLET | Freq: Every day | ORAL | Status: DC
  Administered 2018-05-15: 75 ug via ORAL
  Filled 2018-05-14: qty 1

## 2018-05-14 MED ORDER — VENLAFAXINE HCL ER 75 MG PO CP24
150.0000 mg | ORAL_CAPSULE | Freq: Every day | ORAL | Status: DC
  Administered 2018-05-14 – 2018-05-15 (×2): 150 mg via ORAL
  Filled 2018-05-14 (×2): qty 2

## 2018-05-14 MED ORDER — ONDANSETRON HCL 4 MG/2ML IJ SOLN: 4.0000 mg | Freq: Four times a day (QID) | INTRAMUSCULAR | Status: DC | PRN

## 2018-05-14 MED ORDER — FENTANYL CITRATE (PF) 250 MCG/5ML IJ SOLN
INTRAMUSCULAR | Status: AC
  Filled 2018-05-14: qty 5

## 2018-05-14 MED ORDER — CYCLOSPORINE 0.05 % OP EMUL
1.0000 [drp] | Freq: Two times a day (BID) | OPHTHALMIC | Status: DC
  Administered 2018-05-14 – 2018-05-15 (×2): 1 [drp] via OPHTHALMIC
  Filled 2018-05-14 (×2): qty 1

## 2018-05-14 MED ORDER — POLYETHYLENE GLYCOL 3350 17 G PO PACK: 17.0000 g | PACK | Freq: Every day | ORAL | 0 refills | Status: DC

## 2018-05-14 MED ORDER — EPHEDRINE SULFATE-NACL 50-0.9 MG/10ML-% IV SOSY
PREFILLED_SYRINGE | INTRAVENOUS | Status: DC | PRN
  Administered 2018-05-14: 15 mg via INTRAVENOUS
  Administered 2018-05-14: 10 mg via INTRAVENOUS

## 2018-05-14 MED ORDER — SUCCINYLCHOLINE CHLORIDE 200 MG/10ML IV SOSY
PREFILLED_SYRINGE | INTRAVENOUS | Status: AC
  Filled 2018-05-14: qty 10

## 2018-05-14 MED ORDER — RISAQUAD PO CAPS
1.0000 | ORAL_CAPSULE | Freq: Every day | ORAL | Status: DC
  Administered 2018-05-14 – 2018-05-15 (×2): 1 via ORAL
  Filled 2018-05-14 (×2): qty 1

## 2018-05-14 MED ORDER — BUPIVACAINE-EPINEPHRINE 0.5% -1:200000 IJ SOLN
INTRAMUSCULAR | Status: DC | PRN
  Administered 2018-05-14: 15 mL

## 2018-05-14 MED ORDER — EPHEDRINE 5 MG/ML INJ
INTRAVENOUS | Status: AC
  Filled 2018-05-14: qty 10

## 2018-05-14 MED ORDER — THROMBIN (RECOMBINANT) 20000 UNITS EX SOLR
CUTANEOUS | Status: AC
  Filled 2018-05-14: qty 20000

## 2018-05-14 MED ORDER — LIDOCAINE HCL (CARDIAC) PF 100 MG/5ML IV SOSY
PREFILLED_SYRINGE | INTRAVENOUS | Status: DC | PRN
  Administered 2018-05-14: 100 mg via INTRAVENOUS

## 2018-05-14 MED ORDER — MAGNESIUM CITRATE PO SOLN: 1.0000 | Freq: Once | ORAL | Status: DC | PRN

## 2018-05-14 MED ORDER — DOCUSATE SODIUM 100 MG PO CAPS
100.0000 mg | ORAL_CAPSULE | Freq: Two times a day (BID) | ORAL | Status: DC
  Administered 2018-05-14 – 2018-05-15 (×3): 100 mg via ORAL
  Filled 2018-05-14 (×3): qty 1

## 2018-05-14 MED ORDER — CLINDAMYCIN PHOSPHATE 900 MG/50ML IV SOLN
900.0000 mg | INTRAVENOUS | Status: AC
  Administered 2018-05-14: 900 mg via INTRAVENOUS
  Filled 2018-05-14: qty 50

## 2018-05-14 MED ORDER — DIPHENHYDRAMINE HCL 25 MG PO CAPS
25.0000 mg | ORAL_CAPSULE | Freq: Four times a day (QID) | ORAL | Status: DC | PRN
  Administered 2018-05-15: 25 mg via ORAL
  Filled 2018-05-14 (×2): qty 1

## 2018-05-14 MED ORDER — THROMBIN 20000 UNITS EX SOLR
CUTANEOUS | Status: DC | PRN
  Administered 2018-05-14: 09:00:00 via TOPICAL

## 2018-05-14 MED ORDER — SCOPOLAMINE 1 MG/3DAYS TD PT72
MEDICATED_PATCH | TRANSDERMAL | Status: AC
  Administered 2018-05-14: 1.5 mg via TRANSDERMAL
  Filled 2018-05-14: qty 1

## 2018-05-14 MED ORDER — CEFAZOLIN SODIUM-DEXTROSE 2-4 GM/100ML-% IV SOLN
2.0000 g | INTRAVENOUS | Status: AC
  Administered 2018-05-14: 2 g via INTRAVENOUS
  Filled 2018-05-14: qty 100

## 2018-05-14 MED ORDER — SUGAMMADEX SODIUM 200 MG/2ML IV SOLN
INTRAVENOUS | Status: DC | PRN
  Administered 2018-05-14: 200 mg via INTRAVENOUS

## 2018-05-14 MED ORDER — FENTANYL CITRATE (PF) 100 MCG/2ML IJ SOLN
INTRAMUSCULAR | Status: DC | PRN
  Administered 2018-05-14: 100 ug via INTRAVENOUS
  Administered 2018-05-14: 50 ug via INTRAVENOUS

## 2018-05-14 MED ORDER — ALBUMIN HUMAN 5 % IV SOLN
INTRAVENOUS | Status: DC | PRN
  Administered 2018-05-14 (×4): via INTRAVENOUS

## 2018-05-14 MED ORDER — MUPIROCIN 2 % EX OINT
TOPICAL_OINTMENT | CUTANEOUS | Status: AC
  Filled 2018-05-14: qty 22

## 2018-05-14 MED ORDER — PROPOFOL 10 MG/ML IV BOLUS
INTRAVENOUS | Status: DC | PRN
  Administered 2018-05-14: 150 mg via INTRAVENOUS

## 2018-05-14 MED ORDER — MENTHOL 3 MG MT LOZG: 1.0000 | LOZENGE | OROMUCOSAL | Status: DC | PRN

## 2018-05-14 MED ORDER — SODIUM CHLORIDE 0.9 % IV SOLN
INTRAVENOUS | Status: DC | PRN
  Administered 2018-05-14: 20 ug/min via INTRAVENOUS

## 2018-05-14 MED ORDER — SCOPOLAMINE 1 MG/3DAYS TD PT72
1.0000 | MEDICATED_PATCH | TRANSDERMAL | Status: DC
  Administered 2018-05-14: 1.5 mg via TRANSDERMAL

## 2018-05-14 MED ORDER — ACETAMINOPHEN 325 MG PO TABS: 650.0000 mg | ORAL_TABLET | ORAL | Status: DC | PRN

## 2018-05-14 MED ORDER — MIDAZOLAM HCL 5 MG/5ML IJ SOLN
INTRAMUSCULAR | Status: DC | PRN
  Administered 2018-05-14: 2 mg via INTRAVENOUS

## 2018-05-14 MED ORDER — HYDROMORPHONE HCL 1 MG/ML IJ SOLN
0.2500 mg | INTRAMUSCULAR | Status: AC | PRN
  Administered 2018-05-14: 0.25 mg via INTRAVENOUS
  Administered 2018-05-14: 0.5 mg via INTRAVENOUS
  Administered 2018-05-14: 0.25 mg via INTRAVENOUS
  Administered 2018-05-14: 0.5 mg via INTRAVENOUS

## 2018-05-14 MED ORDER — HYDROCODONE-ACETAMINOPHEN 10-325 MG PO TABS
1.0000 | ORAL_TABLET | ORAL | Status: DC | PRN
  Administered 2018-05-14: 1 via ORAL
  Filled 2018-05-14: qty 1

## 2018-05-14 MED ORDER — LACTATED RINGERS IV SOLN
INTRAVENOUS | Status: DC
  Administered 2018-05-14 (×2): via INTRAVENOUS

## 2018-05-14 MED ORDER — ONDANSETRON HCL 4 MG PO TABS: 4.0000 mg | ORAL_TABLET | Freq: Four times a day (QID) | ORAL | Status: DC | PRN

## 2018-05-14 MED ORDER — DOCUSATE SODIUM 100 MG PO CAPS: 100.0000 mg | ORAL_CAPSULE | Freq: Two times a day (BID) | ORAL | 2 refills | Status: DC

## 2018-05-14 MED ORDER — HYDROCODONE-ACETAMINOPHEN 5-325 MG PO TABS: 1.0000 | ORAL_TABLET | Freq: Four times a day (QID) | ORAL | 0 refills | Status: DC | PRN

## 2018-05-14 MED ORDER — ESTRADIOL 0.1 MG/24HR TD PTWK: 0.1000 mg | MEDICATED_PATCH | TRANSDERMAL | Status: DC

## 2018-05-14 MED ORDER — PROMETHAZINE HCL 25 MG/ML IJ SOLN: 6.2500 mg | INTRAMUSCULAR | Status: DC | PRN

## 2018-05-14 MED ORDER — BISACODYL 5 MG PO TBEC: 5.0000 mg | DELAYED_RELEASE_TABLET | Freq: Every day | ORAL | Status: DC | PRN

## 2018-05-14 MED ORDER — FENTANYL CITRATE (PF) 100 MCG/2ML IJ SOLN: 25.0000 ug | INTRAMUSCULAR | Status: DC | PRN

## 2018-05-14 MED ORDER — PROPOFOL 10 MG/ML IV BOLUS
INTRAVENOUS | Status: AC
  Filled 2018-05-14: qty 20

## 2018-05-14 MED ORDER — POTASSIUM CHLORIDE IN NACL 20-0.9 MEQ/L-% IV SOLN: INTRAVENOUS | Status: DC

## 2018-05-14 MED ORDER — KCL IN DEXTROSE-NACL 20-5-0.45 MEQ/L-%-% IV SOLN: INTRAVENOUS | Status: DC

## 2018-05-14 MED ORDER — HYDROMORPHONE HCL 1 MG/ML IJ SOLN
INTRAMUSCULAR | Status: AC
  Filled 2018-05-14: qty 1

## 2018-05-14 MED ORDER — HYDROCODONE-ACETAMINOPHEN 10-325 MG PO TABS
2.0000 | ORAL_TABLET | ORAL | Status: DC | PRN
  Administered 2018-05-14 – 2018-05-15 (×4): 2 via ORAL
  Filled 2018-05-14 (×4): qty 2

## 2018-05-14 MED ORDER — TRIAMTERENE-HCTZ 37.5-25 MG PO TABS
1.0000 | ORAL_TABLET | Freq: Every morning | ORAL | Status: DC
  Administered 2018-05-14 – 2018-05-15 (×2): 1 via ORAL
  Filled 2018-05-14 (×2): qty 1

## 2018-05-14 MED ORDER — ACETAMINOPHEN 10 MG/ML IV SOLN: 1000.0000 mg | Freq: Once | INTRAVENOUS | Status: DC | PRN

## 2018-05-14 MED ORDER — BUPIVACAINE-EPINEPHRINE 0.5% -1:200000 IJ SOLN
INTRAMUSCULAR | Status: AC
  Filled 2018-05-14: qty 1

## 2018-05-14 MED ORDER — MIDAZOLAM HCL 2 MG/2ML IJ SOLN
INTRAMUSCULAR | Status: AC
  Filled 2018-05-14: qty 2

## 2018-05-14 MED ORDER — METHOCARBAMOL 500 MG PO TABS
500.0000 mg | ORAL_TABLET | Freq: Four times a day (QID) | ORAL | Status: DC | PRN
  Administered 2018-05-14 – 2018-05-15 (×2): 500 mg via ORAL
  Filled 2018-05-14 (×2): qty 1

## 2018-05-14 MED ORDER — ACETAMINOPHEN 10 MG/ML IV SOLN
1000.0000 mg | INTRAVENOUS | Status: AC
  Administered 2018-05-14: 1000 mg via INTRAVENOUS
  Filled 2018-05-14: qty 100

## 2018-05-14 MED ORDER — ALUM & MAG HYDROXIDE-SIMETH 200-200-20 MG/5ML PO SUSP: 30.0000 mL | Freq: Four times a day (QID) | ORAL | Status: DC | PRN

## 2018-05-14 MED ORDER — ACETAMINOPHEN 650 MG RE SUPP: 650.0000 mg | RECTAL | Status: DC | PRN

## 2018-05-14 MED ORDER — CEFAZOLIN SODIUM-DEXTROSE 2-4 GM/100ML-% IV SOLN
2.0000 g | Freq: Three times a day (TID) | INTRAVENOUS | Status: DC
  Administered 2018-05-14 (×2): 2 g via INTRAVENOUS
  Filled 2018-05-14 (×2): qty 100

## 2018-05-14 MED ORDER — METHOCARBAMOL 1000 MG/10ML IJ SOLN
500.0000 mg | Freq: Four times a day (QID) | INTRAVENOUS | Status: DC | PRN
  Filled 2018-05-14: qty 5

## 2018-05-14 MED ORDER — ONDANSETRON HCL 4 MG/2ML IJ SOLN
INTRAMUSCULAR | Status: DC | PRN
  Administered 2018-05-14: 4 mg via INTRAVENOUS

## 2018-05-14 MED ORDER — HYDROMORPHONE HCL 1 MG/ML IJ SOLN: 1.0000 mg | INTRAMUSCULAR | Status: DC | PRN

## 2018-05-14 MED ORDER — POLYETHYLENE GLYCOL 3350 17 G PO PACK: 17.0000 g | PACK | Freq: Every day | ORAL | Status: DC | PRN

## 2018-05-14 MED ORDER — SODIUM CHLORIDE 0.9 % IV SOLN
INTRAVENOUS | Status: DC | PRN
  Administered 2018-05-14: 09:00:00

## 2018-05-14 MED ORDER — PHENOL 1.4 % MT LIQD: 1.0000 | OROMUCOSAL | Status: DC | PRN

## 2018-05-14 SURGICAL SUPPLY — 53 items
BAG DECANTER FOR FLEXI CONT (MISCELLANEOUS) ×2 IMPLANT
CLOTH 2% CHLOROHEXIDINE 3PK (PERSONAL CARE ITEMS) ×2 IMPLANT
CONT SPEC 4OZ CLIKSEAL STRL BL (MISCELLANEOUS) ×2 IMPLANT
COVER WAND RF STERILE (DRAPES) ×2 IMPLANT
DRAPE LAPAROTOMY 100X72X124 (DRAPES) ×2 IMPLANT
DRAPE MICROSCOPE LEICA (MISCELLANEOUS) ×2 IMPLANT
DRAPE SHEET LG 3/4 BI-LAMINATE (DRAPES) ×2 IMPLANT
DRAPE SURG 17X11 SM STRL (DRAPES) ×2 IMPLANT
DRAPE UTILITY XL STRL (DRAPES) ×2 IMPLANT
DRSG AQUACEL AG ADV 3.5X 4 (GAUZE/BANDAGES/DRESSINGS) ×1 IMPLANT
DRSG AQUACEL AG ADV 3.5X 6 (GAUZE/BANDAGES/DRESSINGS) IMPLANT
DRSG OPSITE 4X5.5 SM (GAUZE/BANDAGES/DRESSINGS) ×1 IMPLANT
DRSG TELFA 3X8 NADH (GAUZE/BANDAGES/DRESSINGS) IMPLANT
DURAPREP 26ML APPLICATOR (WOUND CARE) ×2 IMPLANT
DURASEAL SPINE SEALANT 3ML (MISCELLANEOUS) IMPLANT
ELECT BLADE 4.0 EZ CLEAN MEGAD (MISCELLANEOUS) ×2
ELECT REM PT RETURN 9FT ADLT (ELECTROSURGICAL) ×2
ELECTRODE BLDE 4.0 EZ CLN MEGD (MISCELLANEOUS) IMPLANT
ELECTRODE REM PT RTRN 9FT ADLT (ELECTROSURGICAL) ×1 IMPLANT
EVACUATOR 1/8 PVC DRAIN (DRAIN) ×1 IMPLANT
GLOVE BIOGEL PI IND STRL 7.0 (GLOVE) ×1 IMPLANT
GLOVE BIOGEL PI INDICATOR 7.0 (GLOVE) ×1
GLOVE SURG SS PI 7.5 STRL IVOR (GLOVE) ×2 IMPLANT
GLOVE SURG SS PI 8.0 STRL IVOR (GLOVE) ×4 IMPLANT
GOWN STRL REUS W/ TWL LRG LVL3 (GOWN DISPOSABLE) ×1 IMPLANT
GOWN STRL REUS W/ TWL XL LVL3 (GOWN DISPOSABLE) ×1 IMPLANT
GOWN STRL REUS W/TWL LRG LVL3 (GOWN DISPOSABLE) ×6
GOWN STRL REUS W/TWL XL LVL3 (GOWN DISPOSABLE) ×4
IV CATH 14GX2 1/4 (CATHETERS) ×2 IMPLANT
KIT BASIN OR (CUSTOM PROCEDURE TRAY) ×2 IMPLANT
KIT POSITION SURG JACKSON T1 (MISCELLANEOUS) ×2 IMPLANT
NDL SPNL 18GX3.5 QUINCKE PK (NEEDLE) ×2 IMPLANT
NEEDLE 22X1 1/2 (OR ONLY) (NEEDLE) ×2 IMPLANT
NEEDLE SPNL 18GX3.5 QUINCKE PK (NEEDLE) ×4 IMPLANT
PACK LAMINECTOMY NEURO (CUSTOM PROCEDURE TRAY) ×2 IMPLANT
PAD DRESSING TELFA 3X8 NADH (GAUZE/BANDAGES/DRESSINGS) IMPLANT
PATTIES SURGICAL .75X.75 (GAUZE/BANDAGES/DRESSINGS) ×2 IMPLANT
RUBBERBAND STERILE (MISCELLANEOUS) ×4 IMPLANT
SPONGE LAP 4X18 RFD (DISPOSABLE) IMPLANT
SPONGE SURGIFOAM ABS GEL 100 (HEMOSTASIS) ×2 IMPLANT
STAPLER VISISTAT (STAPLE) IMPLANT
STRIP CLOSURE SKIN 1/2X4 (GAUZE/BANDAGES/DRESSINGS) ×2 IMPLANT
SUT NURALON 4 0 TR CR/8 (SUTURE) IMPLANT
SUT PROLENE 3 0 PS 2 (SUTURE) IMPLANT
SUT VIC AB 1 CT1 27 (SUTURE) ×8
SUT VIC AB 1 CT1 27XBRD ANBCTR (SUTURE) IMPLANT
SUT VIC AB 2-0 CT1 27 (SUTURE)
SUT VIC AB 2-0 CT1 TAPERPNT 27 (SUTURE) IMPLANT
SUT VIC AB 2-0 CT2 27 (SUTURE) ×2 IMPLANT
SYR 3ML LL SCALE MARK (SYRINGE) ×2 IMPLANT
TOWEL GREEN STERILE (TOWEL DISPOSABLE) ×2 IMPLANT
TOWEL GREEN STERILE FF (TOWEL DISPOSABLE) ×2 IMPLANT
YANKAUER SUCT BULB TIP NO VENT (SUCTIONS) ×2 IMPLANT

## 2018-05-14 NOTE — Anesthesia Postprocedure Evaluation (Signed)
Anesthesia Post Note  Patient: [name]  Procedure(s) Performed: Microlumbar decompression Lumbar Three-Four, Lumbar Four-Five with Removal Synovial Cyst (N/A Spine Lumbar)     Patient location during evaluation: PACU Anesthesia Type: General Level of consciousness: awake and alert Pain management: pain level controlled Vital Signs Assessment: post-procedure vital signs reviewed and stable Respiratory status: spontaneous breathing, nonlabored ventilation and respiratory function stable Cardiovascular status: blood pressure returned to baseline and stable Postop Assessment: no apparent nausea or vomiting Anesthetic complications: no    Last Vitals:  Vitals:   05/14/18 1306 05/14/18 1345  BP: (!) 122/45 125/72  Pulse: 87 97  Resp: 13 16  Temp:  36.7 C  SpO2: 96% 95%    Last Pain:  Vitals:   05/14/18 1345  TempSrc: Oral  PainSc:                  [name]

## 2018-05-14 NOTE — Interval H&P Note (Signed)
History and Physical Interval Note:  05/14/2018 7:20 AM  [name]  has presented today for surgery, with the diagnosis of Spinal stenosis  The various methods of treatment have been discussed with the patient and family. After consideration of risks, benefits and other options for treatment, the patient has consented to  Procedure(s) with comments: Microlumbar decompression L4-5, possible L3-4, removal synovial cyst possible lateral mass fusion (N/A) - 3 hrs as a surgical intervention .  The patient's history has been reviewed, patient examined, no change in status, stable for surgery.  I have reviewed the patient's chart and labs.  Questions were answered to the patient's satisfaction.     Dravon Nott C

## 2018-05-14 NOTE — Op Note (Signed)
NAME: [name], [name] MEDICAL RECORD UJ:[id number] ACCOUNT [id number] DATE OF BIRTH:1955-08-14 FACILITY: [facility] LOCATION: [facility] PHYSICIAN:Marrio Scribner Connye Burkitt, MD  OPERATIVE REPORT  DATE OF PROCEDURE:  05/14/2018  PREOPERATIVE DIAGNOSES: 1.  Spinal stenosis L4-L5, L3-L4. 2.  Synovial cyst L4-L5, left. 3.  Elevated body mass index of 34.38.  POSTOPERATIVE DIAGNOSES:   1.  Spinal stenosis L4-L5, L3-L4. 2.  Synovial cyst L4-L5, left. 3.  Elevated body mass index of 34.38.  PROCEDURE PERFORMED: 1.  Microlumbar decompression L3-L4, L4-L5. 2.  Foraminotomies L3, L4, L5 bilaterally. 3.  Excision of synovial cyst L4-L5, left.  ANESTHESIA:  General.  ASSISTANT:  Andrez Grime, PA  Technical difficulty increased due to the patient's elevated BMI.  HISTORY:  A 62 year old with bilateral lower extremity radicular pain, L5 nerve root distribution secondary to severe spinal stenosis, facet arthropathy, mild rotational scoliosis, synovial cyst at L4-L5 that extended up into L3-L4.  She was indicated for microlumbar decompression of those levels, possible lateral mass fusion.  Risks and benefits were discussed including bleeding, infection, damage to neurovascular structures, no  change in symptoms, worsening symptoms, DVT, PE, anesthetic complications, etc.  TECHNIQUE:  With the patient in supine position after induction of adequate general anesthesia, 2 g Kefzol and 900 clindamycin as the patient had a history of skin lesions.  MRSA was negative.  She was placed on the [place] table.  All bony prominences  were well padded.  The lumbar region was prepped and draped in the usual sterile fashion.  Two 18-gauge spinal needles were utilized to localize L4-L5, L3-L4 interspace.  Positioning took additional time due to the patient's size.  After appropriate positioning and Foley to gravity, we prepped and draped the lumbar spine.  Two 18-gauge spinal needles utilized to localize L3-L4, L4-L5  interspace, confirmed with x-ray.  Incision was made from the spinous process of L3 to below L5.   Subcutaneous tissue was dissected with electrocautery to achieve hemostasis.  Dorsal lumbar fascia was identified and divided in line with the skin incision after infiltration of 0.25% Marcaine with epinephrine.  Paraspinous muscles elevated from lamina  of L3-L4 and L4-L5.  The deepest McCulloch retractors were utilized; however, these were still slightly short.  We reconfigured to optimal retraction and that was then satisfactory.  Confirmatory radiograph obtained.  Used a Leksell rongeur to remove the  spinous process of L4, partial of L3 and of L5.  Operating microscope was draped and brought onto the surgical field.  We used bone wax on the cancellous surfaces throughout the case.  Used a Gaffer to remove a portion of the lamina of L4  bilaterally with hemilaminotomies of L4 and partial removal of the facets bilaterally at the inferior process of L4.  Following this, I used a micro curette to detach ligamentum flavum from the cephalad edge of L5 and the caudad edge of L4.  With a  Technical brewer the neural elements, we proceeded with hemilaminotomies bilaterally at L4 to the point of detaching the ligamentum flavum and then placed neuro patties beneath the ligamentum flavum for protection.  I detached the ligamentum  flavum from the cephalad edge of L5, and on the left, performed hemilaminotomy of L5 and a foraminotomy of L5, the same on the right.  There was severe lateral recess stenosis noted at L4-L5 on the right.  After removing a portion of the ligamentum  flavum from the interspace at L4-L5, I continued with the laminectomy cephalad.  I then detached ligamentum flavum from  the cephalad edge of L4 with a straight microcurette and from the caudad edge of L3 as it was evident this would require removal of  the neural arch for decompression fully laterally and to perform the  foraminotomies.  We used a Facilities manager and a patty and completed a resection of the neural arch of L4.  There was hypertrophic ligamentum and stenosis at L3-L4 in addition to  L4-L5.  At L3-L4, we decompressed the lateral recess of the medial border of the pedicle bilaterally into the foramens bilaterally.  We utilized a Facilities manager.  There was severe stenosis noted in the foramen of L4 on the right.  We meticulously  developed a plane between the ligamentum flavum and the lateral aspect of the thecal sac and the lateral recess in the superior articulating process of L5.  After performing a foraminotomy of L4 utilizing a combination of an FA20 micro curette and a 2 mm  Kerrison, there was a portion of the superior articulating process that was separate from its remaining lamina.  We skeletonized this and then removed this.  I performed a generous foraminotomy of L4 and decompressed the lateral recess and medial border  of the pedicle, performed a foraminotomy of L5 as well.  There was severe compression of the L5 root and the L4 root on the right noted.  This was then covered with a neuro patty.  We proceeded on the left.  After performing a foraminotomy and  decompressed the lateral recess distally and proximally, it was noted there was a 1 cm diameter expansion of the joint.  It appeared to be the synovial cyst that was noted.  We meticulously developed a plane between the thecal sac and the synovial cyst.   There were multiple adhesions noted with the thecal sac as well as out into the foramen.  I decompressed the synovial cyst after separating it from the thecal sac and the nerve root.  We first used a spinal needle for decompression.  There was  gelatinous fluid expressed and then removed the synovial cyst with a 2 mm Kerrison and a portion of the facet joint.  This was sent to pathology for analysis.  I continued with the foraminotomy of L4.  There was no disk herniation noted.  Following  the  foraminotomy of L4, the neuro probe passed freely out the foramen and good restoration of the thecal sac.  Confirmatory radiograph was obtained demonstrating L3-L4 and L4-L5 decompressed.  We copiously irrigated with antibiotic irrigation.  I used  bipolar cautery as well as thrombin-soaked Gelfoam and bone wax throughout the case to achieve hemostasis.  I then performed a Valsalva at 40.  There was no active bleeding or CSF leakage.  Thrombin-soaked Gelfoam of small patties were placed over the  laminotomy defect.  I removed the [facility] retractor.  After we completed the case, we took the pressure off the wound with retraction.  Released the retraction when x-rays were taken and every hour for a few minutes.  We copiously irrigated the paraspinous musculature.  Meticulously used a bipolar to perform electrocautery.  There was slightly oozing and cannulation of the thecal sac just throughout.  No active bleeding.  We felt a Hemovac would be appropriate and  placed it, brought it out through a stab wound in the skin.  I then repaired the dorsal lumbar fascia with #1 Vicryl interrupted figure-of-eight sutures, subcu with multiple layers of 2-0 and skin with staples.  Extra-long instruments were required to  utilize throughout the case.  A sterile dressing was applied.  She was then placed supine on the hospital bed, extubated without difficulty and transported to the recovery room in satisfactory condition.  The patient tolerated the procedure well.  No complications.  Blood loss 250 mL.  Assistant was [employer].  Again, technical difficulty increased due to the patient's elevated BMI.  Synovial cyst to pathology.  LN/NUANCE  D:05/14/2018 T:05/14/2018 JOB:[id number]

## 2018-05-14 NOTE — Brief Op Note (Signed)
05/14/2018  11:14 AM  PATIENT:  [name]  62 y.o. female  PRE-OPERATIVE DIAGNOSIS:  Spinal stenosis  POST-OPERATIVE DIAGNOSIS:  Spinal stenosis  PROCEDURE:  Procedure(s) with comments: Microlumbar decompression Lumbar Three-Four, Lumbar Four-Five with Removal Synovial Cyst (N/A) - 3 hrs  SURGEON:  Surgeon(s) and Role:    Jene Every, MD - Primary  PHYSICIAN ASSISTANT:   ASSISTANTS: Bissell   ANESTHESIA:   general  EBL:  250 mL   BLOOD ADMINISTERED:none  DRAINS: Hemovac  LOCAL MEDICATIONS USED:  MARCAINE     SPECIMEN:  Source of Specimen:  L45  DISPOSITION OF SPECIMEN:  PATHOLOGY  COUNTS:  YES  TOURNIQUET:  * No tourniquets in log *  DICTATION: .Other Dictation: Dictation Number [id number]  PLAN OF CARE: Admit for overnight observation  PATIENT DISPOSITION:  PACU - hemodynamically stable.   Delay start of Pharmacological VTE agent (>24hrs) due to surgical blood loss or risk of bleeding: yes

## 2018-05-14 NOTE — Transfer of Care (Signed)
Immediate Anesthesia Transfer of Care Note  Patient: [name]  Procedure(s) Performed: Microlumbar decompression Lumbar Three-Four, Lumbar Four-Five with Removal Synovial Cyst (N/A Spine Lumbar)  Patient Location: PACU  Anesthesia Type:General  Level of Consciousness: awake and patient cooperative  Airway & Oxygen Therapy: Patient Spontanous Breathing and Patient connected to nasal cannula oxygen  Post-op Assessment: Report given to RN, Post -op Vital signs reviewed and stable and Patient moving all extremities X 4  Post vital signs: Reviewed and stable  Last Vitals:  Vitals Value Taken Time  BP 122/48 05/14/2018 11:36 AM  Temp    Pulse 91 05/14/2018 11:40 AM  Resp 20 05/14/2018 11:41 AM  SpO2 99 % 05/14/2018 11:40 AM  Vitals shown include unvalidated device data.  Last Pain:  Vitals:   05/14/18 0653  TempSrc:   PainSc: 7       Patients Stated Pain Goal: 3 (05/14/18 5409)  Complications: No apparent anesthesia complications

## 2018-05-14 NOTE — Discharge Instructions (Signed)

## 2018-05-14 NOTE — Anesthesia Procedure Notes (Signed)
Procedure Name: Intubation Date/Time: 05/14/2018 7:37 AM Performed by: Carney Living, CRNA Pre-anesthesia Checklist: Patient identified, Emergency Drugs available, Suction available, Patient being monitored and Timeout performed Patient Re-evaluated:Patient Re-evaluated prior to induction Oxygen Delivery Method: Circle system utilized Preoxygenation: Pre-oxygenation with 100% oxygen Induction Type: IV induction Ventilation: Mask ventilation without difficulty and Oral airway inserted - appropriate to patient size Laryngoscope Size: Mac and 3 Grade View: Grade I Tube type: Oral Tube size: 7.5 mm Number of attempts: 1 Airway Equipment and Method: Stylet Placement Confirmation: ETT inserted through vocal cords under direct vision,  positive ETCO2 and breath sounds checked- equal and bilateral Secured at: 22 cm Tube secured with: Tape Dental Injury: Teeth and Oropharynx as per pre-operative assessment

## 2018-05-14 NOTE — Progress Notes (Signed)
Subjective: Day of Surgery Procedure(s) (LRB): Microlumbar decompression Lumbar Three-Four, Lumbar Four-Five with Removal Synovial Cyst (N/A) Patient reports pain as 3    Objective: Vital signs in last 24 hours: Temp:  [97.3 F (36.3 [name])-98.6 F (37 [name])] 98.3 F (36.8 [name]) (11/07 1929) Pulse Rate:  [67-97] 90 (11/07 1929) Resp:  [11-20] 18 (11/07 1929) BP: (116-143)/(45-77) 131/66 (11/07 1929) SpO2:  [90 %-100 %] 93 % (11/07 1929) Weight:  [86.6 kg] 86.6 kg (11/07 0543)  Intake/Output from previous day: No intake/output data recorded. Intake/Output this shift: No intake/output data recorded.  No results for input(s): HGB in the last 72 hours. No results for input(s): WBC, RBC, HCT, PLT in the last 72 hours. No results for input(s): NA, K, CL, CO2, BUN, CREATININE, GLUCOSE, CALCIUM in the last 72 hours. No results for input(s): LABPT, INR in the last 72 hours.  Patient with no leg pain Motor 5/5. Sensation intact. No clonus or babinski Good pulses. hemovac with 50 cc    Assessment/Plan: Day of Surgery Procedure(s) (LRB): Microlumbar decompression Lumbar Three-Four, Lumbar Four-Five with Removal Synovial Cyst (N/A) Patient doing well. Postop clonus in PACU resolved. Probable anesthesia. No leg pain. Discussed surgery and postop. Will be out of town Academic librarian to cover Dr. Darrelyn Hillock  See in two weeks Drain out in AM.    [name] 05/14/2018, 8:03 PM

## 2018-05-15 ENCOUNTER — Encounter (HOSPITAL_COMMUNITY): Payer: Self-pay | Admitting: Specialist

## 2018-05-15 DIAGNOSIS — F329 Major depressive disorder, single episode, unspecified: Secondary | ICD-10-CM | POA: Diagnosis not present

## 2018-05-15 DIAGNOSIS — Z6834 Body mass index (BMI) 34.0-34.9, adult: Secondary | ICD-10-CM | POA: Diagnosis not present

## 2018-05-15 DIAGNOSIS — N3010 Interstitial cystitis (chronic) without hematuria: Secondary | ICD-10-CM | POA: Diagnosis not present

## 2018-05-15 DIAGNOSIS — Z79899 Other long term (current) drug therapy: Secondary | ICD-10-CM | POA: Diagnosis not present

## 2018-05-15 DIAGNOSIS — M48062 Spinal stenosis, lumbar region with neurogenic claudication: Secondary | ICD-10-CM | POA: Diagnosis not present

## 2018-05-15 DIAGNOSIS — Z90710 Acquired absence of both cervix and uterus: Secondary | ICD-10-CM | POA: Diagnosis not present

## 2018-05-15 DIAGNOSIS — Z880 Allergy status to penicillin: Secondary | ICD-10-CM | POA: Diagnosis not present

## 2018-05-15 DIAGNOSIS — M7138 Other bursal cyst, other site: Secondary | ICD-10-CM | POA: Diagnosis not present

## 2018-05-15 DIAGNOSIS — E039 Hypothyroidism, unspecified: Secondary | ICD-10-CM | POA: Diagnosis not present

## 2018-05-15 DIAGNOSIS — E669 Obesity, unspecified: Secondary | ICD-10-CM | POA: Diagnosis not present

## 2018-05-15 DIAGNOSIS — M5416 Radiculopathy, lumbar region: Secondary | ICD-10-CM | POA: Diagnosis not present

## 2018-05-15 DIAGNOSIS — M1991 Primary osteoarthritis, unspecified site: Secondary | ICD-10-CM | POA: Diagnosis not present

## 2018-05-15 DIAGNOSIS — Z885 Allergy status to narcotic agent status: Secondary | ICD-10-CM | POA: Diagnosis not present

## 2018-05-15 DIAGNOSIS — I10 Essential (primary) hypertension: Secondary | ICD-10-CM | POA: Diagnosis not present

## 2018-05-15 DIAGNOSIS — M419 Scoliosis, unspecified: Secondary | ICD-10-CM | POA: Diagnosis not present

## 2018-05-15 DIAGNOSIS — M797 Fibromyalgia: Secondary | ICD-10-CM | POA: Diagnosis not present

## 2018-05-15 LAB — CBC
HCT: 38.1 % (ref 36.0–46.0)
Hemoglobin: 12 g/dL (ref 12.0–15.0)
MCH: 29 pg (ref 26.0–34.0)
MCHC: 31.5 g/dL (ref 30.0–36.0)
MCV: 92 fL (ref 80.0–100.0)
NRBC: 0 % (ref 0.0–0.2)
Platelets: 339 10*3/uL (ref 150–400)
RBC: 4.14 MIL/uL (ref 3.87–5.11)
RDW: 13.7 % (ref 11.5–15.5)
WBC: 16.7 10*3/uL — AB (ref 4.0–10.5)

## 2018-05-15 LAB — BASIC METABOLIC PANEL
ANION GAP: 8 (ref 5–15)
BUN: 13 mg/dL (ref 8–23)
CALCIUM: 8.4 mg/dL — AB (ref 8.9–10.3)
CHLORIDE: 104 mmol/L (ref 98–111)
CO2: 28 mmol/L (ref 22–32)
CREATININE: 1.02 mg/dL — AB (ref 0.44–1.00)
GFR calc non Af Amer: 57 mL/min — ABNORMAL LOW (ref >60)
Glucose, Bld: 114 mg/dL — ABNORMAL HIGH (ref 70–99)
Potassium: 3.2 mmol/L — ABNORMAL LOW (ref 3.5–5.1)
SODIUM: 140 mmol/L (ref 135–145)

## 2018-05-15 MED FILL — Thrombin (Recombinant) For Soln 20000 Unit: CUTANEOUS | Qty: 1 | Status: AC

## 2018-05-15 NOTE — Progress Notes (Signed)
Patient is discharged from room 3C11 at this time. Alert and in stable condition. IV site d/c'd and instructions read to patient and spouse with understanding verbalized. Left unit via wheelchair with all belongings at side. 

## 2018-05-15 NOTE — Evaluation (Addendum)
Occupational Therapy Evaluation Patient Details Name: [name] MRN: [id number] DOB: 27-Jun-1956 Today's Date: 05/15/2018    History of Present Illness pt is a 62 y/o female now s/p Microlumbar decompression Lumbar Three-Four, Lumbar Four-Five with Removal Synovial Cyst. PMHx includes depression, HTN, endometriosis    Clinical Impression   This 62 y/o female presents with the above. At baseline pt is independent with ADLs, iADLs and functional mobility. Pt performing room level functional mobility without AD and overall minguard assist this session. Demonstrates UB and LB ADL at overall minguard assist level. Pt increasingly anxious this session requiring increased time for completing functional tasks. Reports she will return home initially with fiance' who is able to assist with ADLs PRN. Educated pt re: back precautions, safety and compensatory techniques for completing ADLs while maintaining precautions with pt demonstrating and verbalizing understanding, requiring min cues to maintain precautions this session. Questions answered throughout session with no further OT needs identified at this time. Feel pt is safe to return home from OT standpoint once medically ready. Acute OT to sign off at this time. Thank you for this referral.     Follow Up Recommendations  No OT follow up;Supervision - Intermittent    Equipment Recommendations  3 in 1 bedside commode           Precautions / Restrictions Precautions Precautions: Back;Fall Precaution Booklet Issued: Yes (comment) Precaution Comments: issued and reviewed with pt Required Braces or Orthoses: (per MD order set, no brace needed) Restrictions Weight Bearing Restrictions: No      Mobility Bed Mobility               General bed mobility comments: Pt sitting EOB upon arrival, verbally reviewed log roll technique   Transfers Overall transfer level: Needs assistance Equipment used: None Transfers: Sit to/from Stand Sit to  Stand: Min guard;Supervision         General transfer comment: for general safety, no physical assist required    Balance Overall balance assessment: Mild deficits observed, not formally tested                                         ADL either performed or assessed with clinical judgement   ADL Overall ADL's : Needs assistance/impaired Eating/Feeding: Independent;Sitting   Grooming: Supervision/safety;Oral care;Wash/dry face;Standing   Upper Body Bathing: Supervision/ safety;Sitting   Lower Body Bathing: Supervison/ safety;Sit to/from stand   Upper Body Dressing : Set up;Sitting   Lower Body Dressing: Min guard;Sit to/from stand Lower Body Dressing Details (indicate cue type and reason): pt able to complete figure 4 technique; donning pants with minguard assist Toilet Transfer: Min guard;Ambulation;Regular Teacher, adult education Details (indicate cue type and reason): simulated in transfer to/from EOB Toileting- Clothing Manipulation and Hygiene: Min guard;Sit to/from Nurse, children's Details (indicate cue type and reason): educated on use of 3:1 as shower chair for increased safety and ease for adherence to back precautions Functional mobility during ADLs: Min guard General ADL Comments: pt requiring min cues for maintaining back precautions this session; pt anxious and requiring increased time to perform functional tasks     Vision         Perception     Praxis      Pertinent Vitals/Pain Faces Pain Scale: Hurts little more Pain Location: back Pain Descriptors / Indicators: Aching;Guarding     Hand Dominance  Extremity/Trunk Assessment         Cervical / Trunk Assessment Cervical / Trunk Assessment: Other exceptions Cervical / Trunk Exceptions: s/p lumbar sx   Communication Communication Communication: No difficulties   Cognition Arousal/Alertness: Awake/alert Behavior During Therapy: Anxious Overall Cognitive  Status: Within Functional Limits for tasks assessed                                 General Comments: pt very anxious and chatty throughout session   General Comments       Exercises     Shoulder Instructions      Home Living Family/patient expects to be discharged to:: Private residence Living Arrangements: Alone Available Help at Discharge: Family(plans to stay with fiance initially) Type of Home: House Home Access: Stairs to enter [employer] of Steps: 3 Entrance Stairs-Rails: Right;Left       Bathroom Shower/Tub: Chief Strategy Officer: Standard     Home Equipment: None          Prior Functioning/Environment Level of Independence: Independent        Comments: helps care for her grandchildren        OT Problem List: Decreased activity tolerance;Decreased strength;Decreased knowledge of precautions;Decreased knowledge of use of DME or AE;Pain;Impaired balance (sitting and/or standing)      OT Treatment/Interventions:      OT Goals(Current goals can be found in the care plan section) Acute Rehab OT Goals Patient Stated Goal: regain independence OT Goal Formulation: With patient Time For Goal Achievement: 05/29/18 Potential to Achieve Goals: Good  OT Frequency:     Barriers to D/C:            Co-evaluation              AM-PAC PT "6 Clicks" Daily Activity     Outcome Measure Help from another person eating meals?: None Help from another person taking care of personal grooming?: None Help from another person toileting, which includes using toliet, bedpan, or urinal?: None Help from another person bathing (including washing, rinsing, drying)?: None Help from another person to put on and taking off regular upper body clothing?: None Help from another person to put on and taking off regular lower body clothing?: None 6 Click Score: 24   End of Session Nurse Communication: Mobility status  Activity Tolerance:  Patient tolerated treatment well Patient left: in bed;with call bell/phone within reach;with family/visitor present  OT Visit Diagnosis: Other abnormalities of gait and mobility (R26.89)                Time: 4098-1191 OT Time Calculation (min): 48 min Charges:  OT General Charges $OT Visit: 1 Visit OT Evaluation $OT Eval Low Complexity: 1 Low OT Treatments $Self Care/Home Management : 23-37 mins  Marcy Siren, OT [facility] Pager [phone] Office [phone]   Orlando Penner 05/15/2018, 1:23 PM

## 2018-05-15 NOTE — Progress Notes (Signed)
Subjective: 1 Day Post-Op Procedure(s) (LRB): Microlumbar decompression Lumbar Three-Four, Lumbar Four-Five with Removal Synovial Cyst (N/A) Patient reports pain as mild.  Reports leg pain much improved. Back soreness. No other c/o. Feels ready to go home. No voiding issues.  Objective: Vital signs in last 24 hours: Temp:  [97.3 F (36.3 C)-99.4 F (37.4 C)] 98.5 F (36.9 C) (11/08 0754) Pulse Rate:  [68-97] 68 (11/08 0754) Resp:  [11-19] 16 (11/08 0754) BP: (97-143)/(45-77) 97/61 (11/08 0754) SpO2:  [90 %-100 %] 97 % (11/08 0754)  Intake/Output from previous day: 11/07 0701 - 11/08 0700 In: 3469.5 [P.O.:480; I.V.:1639.5; IV Piggyback:1350] Out: 425 [Urine:60; Drains:40; Blood:325] Intake/Output this shift: No intake/output data recorded.  Recent Labs    05/15/18 0601  HGB 12.0   Recent Labs    05/15/18 0601  WBC 16.7*  RBC 4.14  HCT 38.1  PLT 339   Recent Labs    05/15/18 0601  NA 140  K 3.2*  CL 104  CO2 28  BUN 13  CREATININE 1.02*  GLUCOSE 114*  CALCIUM 8.4*   No results for input(s): LABPT, INR in the last 72 hours.  Neurologically intact ABD soft Neurovascular intact Sensation intact distally Intact pulses distally Dorsiflexion/Plantar flexion intact Incision: dressing C/D/I and scant drainage No cellulitis present Compartment soft no calf pain or sign of DVT  Assessment/Plan: 1 Day Post-Op Procedure(s) (LRB): Microlumbar decompression Lumbar Three-Four, Lumbar Four-Five with Removal Synovial Cyst (N/A) Advance diet Up with therapy D/C IV fluids Discussed D/C instructions, Lspine precautions, dressing instructions Pull drain and dressing change today D/c home   BISSELL, JACLYN M. 05/15/2018, 8:00 AM

## 2018-05-15 NOTE — Evaluation (Signed)
Physical Therapy Evaluation Patient Details Name: [name] MRN: [id number] DOB: Jul 25, 1955 Today's Date: 05/15/2018   History of Present Illness  pt is a 62 y/o female now s/p Microlumbar decompression Lumbar Three-Four, Lumbar Four-Five with Removal Synovial Cyst. PMHx includes depression, HTN, endometriosis   Clinical Impression  Patient evaluated by Physical Therapy with no further acute PT needs identified. All education has been completed and the patient has no further questions. At the time of PT eval pt very anxious and ready to d/c from hospital. Part of therapy session was gait training down into the car for car transfer as pt had even higher anxiety regarding getting into her SUV. Pt was instructed in safe car transfer and performed with close supervision for safety. She was also educated on safe activity progression, and maintenance of precautions. See below for any follow-up Physical Therapy or equipment needs. PT is signing off. Thank you for this referral.     Follow Up Recommendations No PT follow up;Supervision - Intermittent    Equipment Recommendations  None recommended by PT    Recommendations for Other Services       Precautions / Restrictions Precautions Precautions: Back;Fall Precaution Booklet Issued: Yes (comment) Precaution Comments: issued and reviewed with pt Required Braces or Orthoses: (per MD order set, no brace needed) Restrictions Weight Bearing Restrictions: No      Mobility  Bed Mobility               General bed mobility comments: Pt received standing in hall awaiting discharge.   Transfers Overall transfer level: Needs assistance Equipment used: None Transfers: Sit to/from Stand Sit to Stand: Supervision         General transfer comment: for general safety, no physical assist required  Ambulation/Gait Ambulation/Gait assistance: Min guard Gait Distance (Feet): 500 Feet Assistive device: 1 person hand held assist Gait  Pattern/deviations: Step-through pattern;Decreased stride length Gait velocity: Decreased Gait velocity interpretation: 1.31 - 2.62 ft/sec, indicative of limited community ambulator General Gait Details: Overall pt ambulating well however wanting to hold very tightly to therapist's waist. Recommended pt hold to railing in hallway and she was able to do so. Pt prefers to hold to someone rather than use a rail or DME.  Stairs Stairs: Yes Stairs assistance: Min guard;Supervision Stair Management: One rail Right;Step to pattern;Forwards Number of Stairs: 10 General stair comments: VC's for sequencing and general safety. Min guard initially progressing to supervision for safety.   Wheelchair Mobility    Modified Rankin (Stroke Patients Only)       Balance Overall balance assessment: Mild deficits observed, not formally tested                                           Pertinent Vitals/Pain Pain Assessment: Faces Faces Pain Scale: Hurts little more Pain Location: back Pain Descriptors / Indicators: Aching;Guarding    Home Living Family/patient expects to be discharged to:: Private residence Living Arrangements: Alone Available Help at Discharge: Family(plans to stay with fiance initially) Type of Home: House Home Access: Stairs to enter Entrance Stairs-Rails: Doctor, general practice of Steps: 3   Home Equipment: None      Prior Function Level of Independence: Independent         Comments: helps care for her grandchildren     Hand Dominance        Extremity/Trunk Assessment   Upper  Extremity Assessment Upper Extremity Assessment: Overall WFL for tasks assessed    Lower Extremity Assessment Lower Extremity Assessment: Overall WFL for tasks assessed    Cervical / Trunk Assessment Cervical / Trunk Assessment: Other exceptions Cervical / Trunk Exceptions: s/p lumbar sx  Communication   Communication: No difficulties  Cognition  Arousal/Alertness: Awake/alert Behavior During Therapy: Anxious Overall Cognitive Status: Within Functional Limits for tasks assessed                                 General Comments: pt very anxious and chatty throughout session      General Comments      Exercises     Assessment/Plan    PT Assessment Patent does not need any further PT services  PT Problem List         PT Treatment Interventions      PT Goals (Current goals can be found in the Care Plan section)  Acute Rehab PT Goals Patient Stated Goal: Be able to get in her car without help PT Goal Formulation: All assessment and education complete, DC therapy    Frequency     Barriers to discharge        Co-evaluation               AM-PAC PT "6 Clicks" Daily Activity  Outcome Measure Difficulty turning over in bed (including adjusting bedclothes, sheets and blankets)?: None Difficulty moving from lying on back to sitting on the side of the bed? : None Difficulty sitting down on and standing up from a chair with arms (e.g., wheelchair, bedside commode, etc,.)?: None Help needed moving to and from a bed to chair (including a wheelchair)?: A Little Help needed walking in hospital room?: A Little Help needed climbing 3-5 steps with a railing? : A Little 6 Click Score: 21    End of Session Equipment Utilized During Treatment: Gait belt Activity Tolerance: Patient tolerated treatment well Patient left: Other (comment)(In car with fiance' to discharge from hospital ) Nurse Communication: Mobility status PT Visit Diagnosis: Pain Pain - part of body: (back)    Time: 4540-9811 PT Time Calculation (min) (ACUTE ONLY): 20 min   Charges:   PT Evaluation $PT Eval Moderate Complexity: 1 Mod          [name], PT, DPT Acute Rehabilitation Services Pager: [phone] Office: [phone]   [name] 05/15/2018, 1:56 PM

## 2018-06-15 DIAGNOSIS — N3946 Mixed incontinence: Secondary | ICD-10-CM | POA: Diagnosis not present

## 2018-06-15 DIAGNOSIS — N3010 Interstitial cystitis (chronic) without hematuria: Secondary | ICD-10-CM | POA: Diagnosis not present

## 2018-06-22 DIAGNOSIS — M5416 Radiculopathy, lumbar region: Secondary | ICD-10-CM | POA: Diagnosis not present

## 2018-07-03 DIAGNOSIS — M545 Low back pain: Secondary | ICD-10-CM | POA: Diagnosis not present

## 2018-07-13 DIAGNOSIS — M545 Low back pain: Secondary | ICD-10-CM | POA: Diagnosis not present

## 2018-07-20 DIAGNOSIS — M545 Low back pain: Secondary | ICD-10-CM | POA: Diagnosis not present

## 2018-07-23 DIAGNOSIS — M545 Low back pain: Secondary | ICD-10-CM | POA: Diagnosis not present

## 2018-07-27 DIAGNOSIS — M545 Low back pain: Secondary | ICD-10-CM | POA: Diagnosis not present

## 2018-07-31 DIAGNOSIS — N3010 Interstitial cystitis (chronic) without hematuria: Secondary | ICD-10-CM | POA: Diagnosis not present

## 2018-08-07 DIAGNOSIS — M1612 Unilateral primary osteoarthritis, left hip: Secondary | ICD-10-CM | POA: Diagnosis not present

## 2018-08-07 DIAGNOSIS — M4316 Spondylolisthesis, lumbar region: Secondary | ICD-10-CM | POA: Diagnosis not present

## 2018-08-07 DIAGNOSIS — M25562 Pain in left knee: Secondary | ICD-10-CM | POA: Diagnosis not present

## 2018-08-21 DIAGNOSIS — M545 Low back pain: Secondary | ICD-10-CM | POA: Diagnosis not present

## 2018-08-26 DIAGNOSIS — M1612 Unilateral primary osteoarthritis, left hip: Secondary | ICD-10-CM | POA: Diagnosis not present

## 2018-09-10 ENCOUNTER — Other Ambulatory Visit: Payer: Self-pay | Admitting: Women's Health

## 2018-09-10 DIAGNOSIS — Z1231 Encounter for screening mammogram for malignant neoplasm of breast: Secondary | ICD-10-CM

## 2018-09-11 DIAGNOSIS — N3010 Interstitial cystitis (chronic) without hematuria: Secondary | ICD-10-CM | POA: Diagnosis not present

## 2018-09-18 DIAGNOSIS — M48061 Spinal stenosis, lumbar region without neurogenic claudication: Secondary | ICD-10-CM | POA: Diagnosis not present

## 2018-09-18 DIAGNOSIS — M419 Scoliosis, unspecified: Secondary | ICD-10-CM | POA: Diagnosis not present

## 2018-09-18 DIAGNOSIS — M5136 Other intervertebral disc degeneration, lumbar region: Secondary | ICD-10-CM | POA: Diagnosis not present

## 2018-09-18 DIAGNOSIS — M1612 Unilateral primary osteoarthritis, left hip: Secondary | ICD-10-CM | POA: Diagnosis not present

## 2018-09-25 DIAGNOSIS — M1612 Unilateral primary osteoarthritis, left hip: Secondary | ICD-10-CM | POA: Diagnosis not present

## 2018-10-08 DIAGNOSIS — F411 Generalized anxiety disorder: Secondary | ICD-10-CM | POA: Diagnosis not present

## 2018-10-08 DIAGNOSIS — I10 Essential (primary) hypertension: Secondary | ICD-10-CM | POA: Diagnosis not present

## 2018-10-08 DIAGNOSIS — E039 Hypothyroidism, unspecified: Secondary | ICD-10-CM | POA: Diagnosis not present

## 2018-10-08 DIAGNOSIS — E559 Vitamin D deficiency, unspecified: Secondary | ICD-10-CM | POA: Diagnosis not present

## 2018-10-14 ENCOUNTER — Ambulatory Visit: Payer: BLUE CROSS/BLUE SHIELD

## 2018-10-23 DIAGNOSIS — N3010 Interstitial cystitis (chronic) without hematuria: Secondary | ICD-10-CM | POA: Diagnosis not present

## 2018-11-25 ENCOUNTER — Ambulatory Visit: Payer: BLUE CROSS/BLUE SHIELD

## 2018-12-02 ENCOUNTER — Telehealth: Payer: Self-pay | Admitting: *Deleted

## 2018-12-02 DIAGNOSIS — Z79890 Hormone replacement therapy (postmenopausal): Secondary | ICD-10-CM

## 2018-12-02 MED ORDER — ESTRADIOL 0.1 MG/24HR TD PTTW: 1.0000 | MEDICATED_PATCH | TRANSDERMAL | 0 refills | Status: DC

## 2018-12-02 NOTE — Telephone Encounter (Signed)
Okay for Vivelle patch, reviewed best to be on lowest dose of estrogen or no estrogen prior to surgery.  She has not been able to tolerate no estrogen due to hot flushes but if able to decrease dosage or none prior to surgery would be best and have her remind surgeon that she is on an estrogen.Marland Kitchen

## 2018-12-02 NOTE — Telephone Encounter (Signed)
Patient called annual exam is due now, however patient needs hip replacement, not sure when she will have this done due to COVID-19. Not able to walk to have visit, okay to send a 3 month supply in for patient?

## 2018-12-02 NOTE — Telephone Encounter (Signed)
Patient informed with below, Rx sent. 

## 2018-12-04 DIAGNOSIS — N3010 Interstitial cystitis (chronic) without hematuria: Secondary | ICD-10-CM | POA: Diagnosis not present

## 2018-12-07 ENCOUNTER — Ambulatory Visit: Payer: Self-pay | Admitting: Orthopedic Surgery

## 2018-12-08 ENCOUNTER — Encounter: Payer: BLUE CROSS/BLUE SHIELD | Admitting: Women's Health

## 2018-12-14 ENCOUNTER — Ambulatory Visit: Payer: Self-pay | Admitting: Orthopedic Surgery

## 2018-12-14 NOTE — H&P (View-Only) (Signed)
TOTAL HIP ADMISSION H&P  Patient is admitted for left total hip arthroplasty.  Subjective:  Chief Complaint: left hip pain  HPI: [name], 63 y.o. female, has a history of pain and functional disability in the left hip(s) due to arthritis and patient has failed non-surgical conservative treatments for greater than 12 weeks to include NSAID's and/or analgesics, flexibility and strengthening excercises, supervised PT with diminished ADL's post treatment, use of assistive devices, weight reduction as appropriate and activity modification.  Onset of symptoms was gradual starting 2 years ago with gradually worsening course since that time.The patient noted no past surgery on the left hip(s).  Patient currently rates pain in the left hip at 10 out of 10 with activity. Patient has night pain, worsening of pain with activity and weight bearing, trendelenberg gait, pain that interfers with activities of daily living and pain with passive range of motion. Patient has evidence of subchondral cysts, subchondral sclerosis, periarticular osteophytes and joint space narrowing by imaging studies. This condition presents safety issues increasing the risk of falls.  There is no current active infection.  Patient Active Problem List   Diagnosis Date Noted  . Spinal stenosis of lumbar region 05/14/2018  . Spinal stenosis at L4-L5 level 05/14/2018  . IC (interstitial cystitis)   . Depression   . Hypertension   . Hypothyroid   . Menopausal state   . ASCUS (atypical squamous cells of undetermined significance) on Pap smear    Past Medical History:  Diagnosis Date  . Ankle fracture, right   . 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 ASCUS (atypical squamous cells of undetermined significance) on Pap smear 07/2005   NEG FOR HIGH RISK HPV  . Complication of anesthesia   . Depression   . Endometriosis   . Fibromyalgia    left shoulder  . Hypertension   . Hypothyroid   . IC (interstitial cystitis)   . Menopausal state    . PONV (postoperative nausea and vomiting)     Past Surgical History:  Procedure Laterality Date  . ABDOMINAL HYSTERECTOMY     TAH,LSO  . BLADDER SUSPENSION  2005   X2  . CARPAL TUNNEL RELEASE  2011, 2012  . CESAREAN SECTION    . LUMBAR LAMINECTOMY/DECOMPRESSION MICRODISCECTOMY N/A 05/14/2018   Procedure: Microlumbar decompression Lumbar Three-Four, Lumbar Four-Five with Removal Synovial Cyst;  Surgeon: Susa Day, MD;  Location: [facility];  Service: Orthopedics;  Laterality: N/A;  3 hrs  . OOPHORECTOMY     LSO WITH TAH  . PELVIC LAPAROSCOPY    . right broken ankle repair  2014  . TOE SURGERY  05/2009   PIN IN OUTER TOE RIGHT FOOT    Current Outpatient Medications  Medication Sig Dispense Refill Last Dose  . cycloSPORINE (RESTASIS) 0.05 % ophthalmic emulsion Place 1 drop into both eyes 2 (two) times daily.   05/14/2018 at 0400  . diphenhydrAMINE (BENADRYL) 25 MG tablet Take 25 mg by mouth every 6 (six) hours as needed for allergies.   05/13/2018 at Unknown time  . docusate sodium (COLACE) 100 MG capsule Take 1 capsule (100 mg total) by mouth 2 (two) times daily. 60 capsule 2   . ELMIRON 100 MG capsule 200 mg every 6 (six) weeks. Insert 200 mg into bladder every 6 weeks  3 Past Month at Unknown time  . estradiol (VIVELLE-DOT) 0.1 MG/24HR patch Place 1 patch (0.1 mg total) onto the skin 2 (two) times a week. 24 patch 0   .  HYDROcodone-acetaminophen (NORCO/VICODIN) 5-325 MG tablet Take 1-2 tablets by mouth every 6 (six) hours as needed for moderate pain. 40 tablet 0   . hydrOXYzine (ATARAX/VISTARIL) 25 MG tablet Take 25 mg by mouth every 8 (eight) hours as needed (bladder pain).    Past Week at Unknown time  . levothyroxine (SYNTHROID, LEVOTHROID) 75 MCG tablet Take 75 mcg by mouth daily before breakfast.   1 05/14/2018 at 0100  . methocarbamol (ROBAXIN) 500 MG tablet Take 1 tablet (500 mg total) by mouth every 6 (six) hours as needed for muscle spasms. 40 tablet 1   . polyethylene glycol  (MIRALAX) packet Take 17 g by mouth daily. 14 each 0   . triamterene-hydrochlorothiazide (MAXZIDE-25) 37.5-25 MG tablet Take 1 tablet by mouth every morning.  1 05/13/2018 at Unknown time  . venlafaxine (EFFEXOR-XR) 150 MG 24 hr capsule Take 150 mg by mouth daily.     05/13/2018 at Unknown time   No current facility-administered medications for this visit.    Allergies  Allergen Reactions  . Amoxicillin-Pot Clavulanate Nausea And Vomiting  . Bactrim Nausea And Vomiting  . Codeine Rash  . Morphine And Related Rash  . Oxycodone-Acetaminophen Other (See Comments)    HOT FLASHES HALLUCINATIONS     Social History   Tobacco Use  . Smoking status: Never Smoker  . Smokeless tobacco: Never Used  Substance Use Topics  . Alcohol use: No    Family History  Problem Relation Age of Onset  . Hypertension Mother   . Breast cancer Mother        70'S     Review of Systems  Constitutional: Positive for diaphoresis.  HENT: Positive for hearing loss and tinnitus.   Eyes: Negative.   Respiratory: Negative.   Cardiovascular: Negative.   Gastrointestinal: Negative.   Genitourinary: Negative.   Musculoskeletal: Positive for back pain, joint pain and myalgias.  Skin: Negative.   Neurological: Negative.   Endo/Heme/Allergies: Positive for environmental allergies.  Psychiatric/Behavioral: Negative.     Objective:  Physical Exam  Vitals reviewed. Constitutional: She is oriented to person, place, and time. She appears well-developed and well-nourished.  HENT:  Head: Normocephalic and atraumatic.  Eyes: Pupils are equal, round, and reactive to light. Conjunctivae and EOM are normal.  Neck: Normal range of motion. Neck supple.  Cardiovascular: Normal rate, regular rhythm and intact distal pulses.  Respiratory: Effort normal. No respiratory distress.  GI: Soft. She exhibits no distension.  Genitourinary:    Genitourinary Comments: deferred   Musculoskeletal:     Left hip: She exhibits  decreased range of motion and bony tenderness.  Neurological: She is alert and oriented to person, place, and time. She has normal reflexes.  Skin: Skin is warm and dry.  Psychiatric: She has a normal mood and affect. Her behavior is normal. Judgment and thought content normal.    Vital signs in last 24 hours: @VSRANGES@  Labs:   Estimated body mass index is 34.38 kg/m as calculated from the following:   Height as of 05/14/18: 5' 2.5" (1.588 m).   Weight as of 05/14/18: 86.6 kg.   Imaging Review Plain radiographs demonstrate severe degenerative joint disease of the left hip(s). The bone quality appears to be adequate for age and reported activity level.      Assessment/Plan:  End stage arthritis, left hip(s)  The patient history, physical examination, clinical judgement of the provider and imaging studies are consistent with end stage degenerative joint disease of the left hip(s) and total hip   arthroplasty is deemed medically necessary. The treatment options including medical management, injection therapy, arthroscopy and arthroplasty were discussed at length. The risks and benefits of total hip arthroplasty were presented and reviewed. The risks due to aseptic loosening, infection, stiffness, dislocation/subluxation,  thromboembolic complications and other imponderables were discussed.  The patient acknowledged the explanation, agreed to proceed with the plan and consent was signed. Patient is being admitted for inpatient treatment for surgery, pain control, PT, OT, prophylactic antibiotics, VTE prophylaxis, progressive ambulation and ADL's and discharge planning.The patient is planning to be discharged home with HEP    Patient's anticipated LOS is less than 2 midnights, meeting these requirements: - Younger than 3165 - Lives within 1 hour of care - Has a competent adult at home to recover with post-op recover - NO history of  - Chronic pain requiring opiods  - Diabetes  -  Coronary Artery Disease  - Heart failure  - Heart attack  - Stroke  - DVT/VTE  - Cardiac arrhythmia  - Respiratory Failure/COPD  - Renal failure  - Anemia  - Advanced Liver disease

## 2018-12-14 NOTE — H&P (Signed)
TOTAL HIP ADMISSION H&P  Patient is admitted for left total hip arthroplasty.  Subjective:  Chief Complaint: left hip pain  HPI: [name], 63 y.o. female, has a history of pain and functional disability in the left hip(s) due to arthritis and patient has failed non-surgical conservative treatments for greater than 12 weeks to include NSAID's and/or analgesics, flexibility and strengthening excercises, supervised PT with diminished ADL's post treatment, use of assistive devices, weight reduction as appropriate and activity modification.  Onset of symptoms was gradual starting 2 years ago with gradually worsening course since that time.The patient noted no past surgery on the left hip(s).  Patient currently rates pain in the left hip at 10 out of 10 with activity. Patient has night pain, worsening of pain with activity and weight bearing, trendelenberg gait, pain that interfers with activities of daily living and pain with passive range of motion. Patient has evidence of subchondral cysts, subchondral sclerosis, periarticular osteophytes and joint space narrowing by imaging studies. This condition presents safety issues increasing the risk of falls.  There is no current active infection.  Patient Active Problem List   Diagnosis Date Noted  . Spinal stenosis of lumbar region 05/14/2018  . Spinal stenosis at L4-L5 level 05/14/2018  . IC (interstitial cystitis)   . Depression   . Hypertension   . Hypothyroid   . Menopausal state   . ASCUS (atypical squamous cells of undetermined significance) on Pap smear    Past Medical History:  Diagnosis Date  . Ankle fracture, right   . 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 ASCUS (atypical squamous cells of undetermined significance) on Pap smear 07/2005   NEG FOR HIGH RISK HPV  . Complication of anesthesia   . Depression   . Endometriosis   . Fibromyalgia    left shoulder  . Hypertension   . Hypothyroid   . IC (interstitial cystitis)   . Menopausal state    . PONV (postoperative nausea and vomiting)     Past Surgical History:  Procedure Laterality Date  . ABDOMINAL HYSTERECTOMY     TAH,LSO  . BLADDER SUSPENSION  2005   X2  . CARPAL TUNNEL RELEASE  2011, 2012  . CESAREAN SECTION    . LUMBAR LAMINECTOMY/DECOMPRESSION MICRODISCECTOMY N/A 05/14/2018   Procedure: Microlumbar decompression Lumbar Three-Four, Lumbar Four-Five with Removal Synovial Cyst;  Surgeon: Susa Day, MD;  Location: [facility];  Service: Orthopedics;  Laterality: N/A;  3 hrs  . OOPHORECTOMY     LSO WITH TAH  . PELVIC LAPAROSCOPY    . right broken ankle repair  2014  . TOE SURGERY  05/2009   PIN IN OUTER TOE RIGHT FOOT    Current Outpatient Medications  Medication Sig Dispense Refill Last Dose  . cycloSPORINE (RESTASIS) 0.05 % ophthalmic emulsion Place 1 drop into both eyes 2 (two) times daily.   05/14/2018 at 0400  . diphenhydrAMINE (BENADRYL) 25 MG tablet Take 25 mg by mouth every 6 (six) hours as needed for allergies.   05/13/2018 at Unknown time  . docusate sodium (COLACE) 100 MG capsule Take 1 capsule (100 mg total) by mouth 2 (two) times daily. 60 capsule 2   . ELMIRON 100 MG capsule 200 mg every 6 (six) weeks. Insert 200 mg into bladder every 6 weeks  3 Past Month at Unknown time  . estradiol (VIVELLE-DOT) 0.1 MG/24HR patch Place 1 patch (0.1 mg total) onto the skin 2 (two) times a week. 24 patch 0   .  HYDROcodone-acetaminophen (NORCO/VICODIN) 5-325 MG tablet Take 1-2 tablets by mouth every 6 (six) hours as needed for moderate pain. 40 tablet 0   . hydrOXYzine (ATARAX/VISTARIL) 25 MG tablet Take 25 mg by mouth every 8 (eight) hours as needed (bladder pain).    Past Week at Unknown time  . levothyroxine (SYNTHROID, LEVOTHROID) 75 MCG tablet Take 75 mcg by mouth daily before breakfast.   1 05/14/2018 at 0100  . methocarbamol (ROBAXIN) 500 MG tablet Take 1 tablet (500 mg total) by mouth every 6 (six) hours as needed for muscle spasms. 40 tablet 1   . polyethylene glycol  (MIRALAX) packet Take 17 g by mouth daily. 14 each 0   . triamterene-hydrochlorothiazide (MAXZIDE-25) 37.5-25 MG tablet Take 1 tablet by mouth every morning.  1 05/13/2018 at Unknown time  . venlafaxine (EFFEXOR-XR) 150 MG 24 hr capsule Take 150 mg by mouth daily.     05/13/2018 at Unknown time   No current facility-administered medications for this visit.    Allergies  Allergen Reactions  . Amoxicillin-Pot Clavulanate Nausea And Vomiting  . Bactrim Nausea And Vomiting  . Codeine Rash  . Morphine And Related Rash  . Oxycodone-Acetaminophen Other (See Comments)    HOT FLASHES HALLUCINATIONS     Social History   Tobacco Use  . Smoking status: Never Smoker  . Smokeless tobacco: Never Used  Substance Use Topics  . Alcohol use: No    Family History  Problem Relation Age of Onset  . Hypertension Mother   . Breast cancer Mother        70'S     Review of Systems  Constitutional: Positive for diaphoresis.  HENT: Positive for hearing loss and tinnitus.   Eyes: Negative.   Respiratory: Negative.   Cardiovascular: Negative.   Gastrointestinal: Negative.   Genitourinary: Negative.   Musculoskeletal: Positive for back pain, joint pain and myalgias.  Skin: Negative.   Neurological: Negative.   Endo/Heme/Allergies: Positive for environmental allergies.  Psychiatric/Behavioral: Negative.     Objective:  Physical Exam  Vitals reviewed. Constitutional: She is oriented to person, place, and time. She appears well-developed and well-nourished.  HENT:  Head: Normocephalic and atraumatic.  Eyes: Pupils are equal, round, and reactive to light. Conjunctivae and EOM are normal.  Neck: Normal range of motion. Neck supple.  Cardiovascular: Normal rate, regular rhythm and intact distal pulses.  Respiratory: Effort normal. No respiratory distress.  GI: Soft. She exhibits no distension.  Genitourinary:    Genitourinary Comments: deferred   Musculoskeletal:     Left hip: She exhibits  decreased range of motion and bony tenderness.  Neurological: She is alert and oriented to person, place, and time. She has normal reflexes.  Skin: Skin is warm and dry.  Psychiatric: She has a normal mood and affect. Her behavior is normal. Judgment and thought content normal.    Vital signs in last 24 hours: @VSRANGES @  Labs:   Estimated body mass index is 34.38 kg/m as calculated from the following:   Height as of 05/14/18: 5' 2.5" (1.588 m).   Weight as of 05/14/18: 86.6 kg.   Imaging Review Plain radiographs demonstrate severe degenerative joint disease of the left hip(s). The bone quality appears to be adequate for age and reported activity level.      Assessment/Plan:  End stage arthritis, left hip(s)  The patient history, physical examination, clinical judgement of the provider and imaging studies are consistent with end stage degenerative joint disease of the left hip(s) and total hip  arthroplasty is deemed medically necessary. The treatment options including medical management, injection therapy, arthroscopy and arthroplasty were discussed at length. The risks and benefits of total hip arthroplasty were presented and reviewed. The risks due to aseptic loosening, infection, stiffness, dislocation/subluxation,  thromboembolic complications and other imponderables were discussed.  The patient acknowledged the explanation, agreed to proceed with the plan and consent was signed. Patient is being admitted for inpatient treatment for surgery, pain control, PT, OT, prophylactic antibiotics, VTE prophylaxis, progressive ambulation and ADL's and discharge planning.The patient is planning to be discharged home with HEP    Patient's anticipated LOS is less than 2 midnights, meeting these requirements: - Younger than 3165 - Lives within 1 hour of care - Has a competent adult at home to recover with post-op recover - NO history of  - Chronic pain requiring opiods  - Diabetes  -  Coronary Artery Disease  - Heart failure  - Heart attack  - Stroke  - DVT/VTE  - Cardiac arrhythmia  - Respiratory Failure/COPD  - Renal failure  - Anemia  - Advanced Liver disease

## 2018-12-17 ENCOUNTER — Inpatient Hospital Stay (HOSPITAL_COMMUNITY)
Admission: RE | Admit: 2018-12-17 | Payer: BLUE CROSS/BLUE SHIELD | Source: Home / Self Care | Admitting: Orthopedic Surgery

## 2018-12-17 ENCOUNTER — Encounter (HOSPITAL_COMMUNITY): Admission: RE | Payer: Self-pay | Source: Home / Self Care

## 2018-12-17 SURGERY — ARTHROPLASTY, HIP, TOTAL, ANTERIOR APPROACH
Anesthesia: Spinal | Laterality: Left

## 2018-12-17 NOTE — Patient Instructions (Addendum)
YOU ARE REQUIRED TO BE TESTED FOR COVID-19 PRIOR TO YOUR SURGERY . YOUR TEST MUST BE COMPLETED ON Monday, June 15th.  TESTING IS LOCATED AT [facility]TR[facility] FROM 9:00AM - 3:00PM. FAILURE TO COMPLETE TESTING MAY RESULT IN CANCELLATION OF YOUR SURGERY.                 [name]     Your procedure is scheduled on: 12-24-2018   Report to [facility] Main  Entrance     Report to short stay  at  5:30 AM     Call this number if you have problems the morning of surgery [phone]     Remember: Do not eat food or drink liquids :After Midnight. BRUSH YOUR TEETH MORNING OF SURGERY AND RINSE YOUR MOUTH OUT, NO CHEWING GUM CANDY OR MINTS.     Take these medicines the morning of surgery with A SIP OF WATER: gabapentin (neurontin), eye drop, levothyroxine                                You may not have any metal on your body including hair pins and              piercings  Do not wear jewelry, make-up, lotions, powders or perfumes, deodorant             Do not wear nail polish.  Do not shave  48 hours prior to surgery.                 Do not bring valuables to the hospital. [place].  Contacts, dentures or bridgework may not be worn into surgery.  Leave suitcase in the car. After surgery it may be brought to your room.                  Please read over the following fact sheets you were given: _____________________________________________________________________             [facility] - Preparing for Surgery Before surgery, you can play an important role.  Because skin is not sterile, your skin needs to be as free of germs as possible.  You can reduce the number of germs on your skin by washing with CHG (chlorahexidine gluconate) soap before surgery.  CHG is an antiseptic cleaner which kills germs and bonds with the skin to continue killing germs even after washing. Please DO NOT use if you  have an allergy to CHG or antibacterial soaps.  If your skin becomes reddened/irritated stop using the CHG and inform your nurse when you arrive at Short Stay. Do not shave (including legs and underarms) for at least 48 hours prior to the first CHG shower.  You may shave your face/neck. Please follow these instructions carefully:  1.  Shower with CHG Soap the night before surgery and the  morning of Surgery.  2.  If you choose to wash your hair, wash your hair first as usual with your  normal  shampoo.  3.  After you shampoo, rinse your hair and body thoroughly to remove the  shampoo.                           4.  Use CHG as you would  any other liquid soap.  You can apply chg directly  to the skin and wash                       Gently with a scrungie or clean washcloth.  5.  Apply the CHG Soap to your body ONLY FROM THE NECK DOWN.   Do not use on face/ open                           Wound or open sores. Avoid contact with eyes, ears mouth and genitals (private parts).                       Wash face,  Genitals (private parts) with your normal soap.             6.  Wash thoroughly, paying special attention to the area where your surgery  will be performed.  7.  Thoroughly rinse your body with warm water from the neck down.  8.  DO NOT shower/wash with your normal soap after using and rinsing off  the CHG Soap.                9.  Pat yourself dry with a clean towel.            10.  Wear clean pajamas.            11.  Place clean sheets on your bed the night of your first shower and do not  sleep with pets. Day of Surgery : Do not apply any lotions/deodorants the morning of surgery.  Please wear clean clothes to the hospital/surgery center.  FAILURE TO FOLLOW THESE INSTRUCTIONS MAY RESULT IN THE CANCELLATION OF YOUR SURGERY PATIENT SIGNATURE_________________________________  NURSE  SIGNATURE__________________________________  ________________________________________________________________________

## 2018-12-17 NOTE — Progress Notes (Signed)
NEED ORDERS ASP FOR 6-18 SURGERY PRE OP IS 800 AM 6-12

## 2018-12-18 ENCOUNTER — Encounter (HOSPITAL_COMMUNITY): Payer: Self-pay

## 2018-12-18 ENCOUNTER — Other Ambulatory Visit: Payer: Self-pay

## 2018-12-18 ENCOUNTER — Encounter (HOSPITAL_COMMUNITY)
Admission: RE | Admit: 2018-12-18 | Discharge: 2018-12-18 | Disposition: A | Discharge status: Home / Self Care | Payer: BC Managed Care – PPO | Source: Ambulatory Visit | Attending: Orthopedic Surgery | Admitting: Orthopedic Surgery

## 2018-12-18 DIAGNOSIS — Z01812 Encounter for preprocedural laboratory examination: Secondary | ICD-10-CM | POA: Diagnosis not present

## 2018-12-18 LAB — CBC
HCT: 45.1 % (ref 36.0–46.0)
Hemoglobin: 14.6 g/dL (ref 12.0–15.0)
MCH: 29.8 pg (ref 26.0–34.0)
MCHC: 32.4 g/dL (ref 30.0–36.0)
MCV: 92 fL (ref 80.0–100.0)
Platelets: 332 10*3/uL (ref 150–400)
RBC: 4.9 MIL/uL (ref 3.87–5.11)
RDW: 13 % (ref 11.5–15.5)
WBC: 7.2 10*3/uL (ref 4.0–10.5)
nRBC: 0 % (ref 0.0–0.2)

## 2018-12-18 LAB — SURGICAL PCR SCREEN
MRSA, PCR: NEGATIVE
Staphylococcus aureus: POSITIVE — AB

## 2018-12-18 LAB — BASIC METABOLIC PANEL
Anion gap: 8 (ref 5–15)
BUN: 20 mg/dL (ref 8–23)
CO2: 27 mmol/L (ref 22–32)
Calcium: 8.9 mg/dL (ref 8.9–10.3)
Chloride: 102 mmol/L (ref 98–111)
Creatinine, Ser: 0.69 mg/dL (ref 0.44–1.00)
GFR calc Af Amer: >60 mL/min (ref >60)
GFR calc non Af Amer: >60 mL/min (ref >60)
Glucose, Bld: 105 mg/dL — ABNORMAL HIGH (ref 70–99)
Potassium: 3.3 mmol/L — ABNORMAL LOW (ref 3.5–5.1)
Sodium: 137 mmol/L (ref 135–145)

## 2018-12-21 ENCOUNTER — Other Ambulatory Visit (HOSPITAL_COMMUNITY)
Admission: RE | Admit: 2018-12-21 | Discharge: 2018-12-21 | Disposition: A | Discharge status: Home / Self Care | Payer: BC Managed Care – PPO | Source: Ambulatory Visit | Attending: Orthopedic Surgery | Admitting: Orthopedic Surgery

## 2018-12-21 DIAGNOSIS — Z1159 Encounter for screening for other viral diseases: Secondary | ICD-10-CM | POA: Insufficient documentation

## 2018-12-22 LAB — NOVEL CORONAVIRUS, NAA (HOSP ORDER, SEND-OUT TO REF LAB; TAT 18-24 HRS): SARS-CoV-2, NAA: NOT DETECTED

## 2018-12-24 ENCOUNTER — Ambulatory Visit (HOSPITAL_COMMUNITY): Payer: BC Managed Care – PPO | Admitting: Anesthesiology

## 2018-12-24 ENCOUNTER — Inpatient Hospital Stay (HOSPITAL_COMMUNITY): Payer: BC Managed Care – PPO

## 2018-12-24 ENCOUNTER — Ambulatory Visit (HOSPITAL_COMMUNITY): Payer: BC Managed Care – PPO | Admitting: Physician Assistant

## 2018-12-24 ENCOUNTER — Encounter (HOSPITAL_COMMUNITY)
Admission: AD | Disposition: A | Discharge status: Home / Self Care | Payer: Self-pay | Source: Home / Self Care | Attending: Orthopedic Surgery

## 2018-12-24 ENCOUNTER — Observation Stay (HOSPITAL_COMMUNITY)
Admission: AD | Admit: 2018-12-24 | Discharge: 2018-12-25 | Disposition: A | Discharge status: Home / Self Care | Payer: BC Managed Care – PPO | Attending: Orthopedic Surgery | Admitting: Orthopedic Surgery

## 2018-12-24 ENCOUNTER — Other Ambulatory Visit: Payer: Self-pay

## 2018-12-24 ENCOUNTER — Ambulatory Visit (HOSPITAL_COMMUNITY): Payer: BC Managed Care – PPO

## 2018-12-24 ENCOUNTER — Encounter (HOSPITAL_COMMUNITY): Payer: Self-pay

## 2018-12-24 DIAGNOSIS — M797 Fibromyalgia: Secondary | ICD-10-CM | POA: Insufficient documentation

## 2018-12-24 DIAGNOSIS — Z79890 Hormone replacement therapy (postmenopausal): Secondary | ICD-10-CM | POA: Diagnosis not present

## 2018-12-24 DIAGNOSIS — M1612 Unilateral primary osteoarthritis, left hip: Secondary | ICD-10-CM | POA: Diagnosis present

## 2018-12-24 DIAGNOSIS — F329 Major depressive disorder, single episode, unspecified: Secondary | ICD-10-CM | POA: Diagnosis not present

## 2018-12-24 DIAGNOSIS — G709 Myoneural disorder, unspecified: Secondary | ICD-10-CM | POA: Diagnosis not present

## 2018-12-24 DIAGNOSIS — Z471 Aftercare following joint replacement surgery: Secondary | ICD-10-CM | POA: Diagnosis not present

## 2018-12-24 DIAGNOSIS — R2689 Other abnormalities of gait and mobility: Secondary | ICD-10-CM | POA: Diagnosis not present

## 2018-12-24 DIAGNOSIS — M8568 Other cyst of bone, other site: Secondary | ICD-10-CM | POA: Insufficient documentation

## 2018-12-24 DIAGNOSIS — M24152 Other articular cartilage disorders, left hip: Secondary | ICD-10-CM | POA: Diagnosis not present

## 2018-12-24 DIAGNOSIS — Z79899 Other long term (current) drug therapy: Secondary | ICD-10-CM | POA: Diagnosis not present

## 2018-12-24 DIAGNOSIS — I10 Essential (primary) hypertension: Secondary | ICD-10-CM | POA: Diagnosis not present

## 2018-12-24 DIAGNOSIS — N3010 Interstitial cystitis (chronic) without hematuria: Secondary | ICD-10-CM | POA: Insufficient documentation

## 2018-12-24 DIAGNOSIS — Z419 Encounter for procedure for purposes other than remedying health state, unspecified: Secondary | ICD-10-CM

## 2018-12-24 DIAGNOSIS — Z96642 Presence of left artificial hip joint: Secondary | ICD-10-CM | POA: Diagnosis not present

## 2018-12-24 DIAGNOSIS — M25852 Other specified joint disorders, left hip: Secondary | ICD-10-CM | POA: Insufficient documentation

## 2018-12-24 DIAGNOSIS — M25752 Osteophyte, left hip: Secondary | ICD-10-CM | POA: Diagnosis not present

## 2018-12-24 DIAGNOSIS — E039 Hypothyroidism, unspecified: Secondary | ICD-10-CM | POA: Diagnosis not present

## 2018-12-24 DIAGNOSIS — Z09 Encounter for follow-up examination after completed treatment for conditions other than malignant neoplasm: Secondary | ICD-10-CM

## 2018-12-24 DIAGNOSIS — Z4781 Encounter for orthopedic aftercare following surgical amputation: Secondary | ICD-10-CM | POA: Diagnosis not present

## 2018-12-24 HISTORY — PX: TOTAL HIP ARTHROPLASTY: SHX124

## 2018-12-24 LAB — TYPE AND SCREEN
ABO/RH(D): A NEG
Antibody Screen: NEGATIVE

## 2018-12-24 LAB — ABO/RH: ABO/RH(D): A NEG

## 2018-12-24 SURGERY — ARTHROPLASTY, HIP, TOTAL, ANTERIOR APPROACH
Anesthesia: Spinal | Site: Hip | Laterality: Left

## 2018-12-24 MED ORDER — SODIUM CHLORIDE 0.9 % IV SOLN
INTRAVENOUS | Status: DC
  Administered 2018-12-24: 100 mL/h via INTRAVENOUS
  Administered 2018-12-24: 15:00:00 via INTRAVENOUS

## 2018-12-24 MED ORDER — LACTATED RINGERS IV SOLN
INTRAVENOUS | Status: DC
  Administered 2018-12-24: 07:00:00 via INTRAVENOUS

## 2018-12-24 MED ORDER — PHENYLEPHRINE 40 MCG/ML (10ML) SYRINGE FOR IV PUSH (FOR BLOOD PRESSURE SUPPORT)
PREFILLED_SYRINGE | INTRAVENOUS | Status: DC | PRN
  Administered 2018-12-24 (×3): 80 ug via INTRAVENOUS

## 2018-12-24 MED ORDER — PROPOFOL 10 MG/ML IV BOLUS
INTRAVENOUS | Status: AC
  Filled 2018-12-24: qty 20

## 2018-12-24 MED ORDER — KETOROLAC TROMETHAMINE 15 MG/ML IJ SOLN
15.0000 mg | Freq: Four times a day (QID) | INTRAMUSCULAR | Status: AC
  Administered 2018-12-24 – 2018-12-25 (×4): 15 mg via INTRAVENOUS
  Filled 2018-12-24 (×3): qty 1

## 2018-12-24 MED ORDER — HYDROMORPHONE HCL 1 MG/ML IJ SOLN
INTRAMUSCULAR | Status: AC
  Administered 2018-12-24: 12:00:00 0.5 mg via INTRAVENOUS
  Filled 2018-12-24: qty 1

## 2018-12-24 MED ORDER — PHENYLEPHRINE 40 MCG/ML (10ML) SYRINGE FOR IV PUSH (FOR BLOOD PRESSURE SUPPORT)
PREFILLED_SYRINGE | INTRAVENOUS | Status: AC
  Filled 2018-12-24: qty 10

## 2018-12-24 MED ORDER — ONDANSETRON HCL 4 MG/2ML IJ SOLN
INTRAMUSCULAR | Status: AC
  Filled 2018-12-24: qty 2

## 2018-12-24 MED ORDER — BUPIVACAINE-EPINEPHRINE (PF) 0.25% -1:200000 IJ SOLN
INTRAMUSCULAR | Status: AC
  Filled 2018-12-24: qty 30

## 2018-12-24 MED ORDER — LACTATED RINGERS IV SOLN: INTRAVENOUS | Status: DC

## 2018-12-24 MED ORDER — SODIUM CHLORIDE 0.9 % IV SOLN: INTRAVENOUS | Status: DC

## 2018-12-24 MED ORDER — HYDROMORPHONE HCL 1 MG/ML IJ SOLN
INTRAMUSCULAR | Status: AC
  Administered 2018-12-24: 0.25 mg via INTRAVENOUS
  Filled 2018-12-24: qty 1

## 2018-12-24 MED ORDER — ONDANSETRON HCL 4 MG/2ML IJ SOLN
INTRAMUSCULAR | Status: DC | PRN
  Administered 2018-12-24: 4 mg via INTRAVENOUS

## 2018-12-24 MED ORDER — MENTHOL 3 MG MT LOZG: 1.0000 | LOZENGE | OROMUCOSAL | Status: DC | PRN

## 2018-12-24 MED ORDER — ACETAMINOPHEN 160 MG/5ML PO SOLN: 325.0000 mg | Freq: Once | ORAL | Status: DC | PRN

## 2018-12-24 MED ORDER — ONDANSETRON HCL 4 MG/2ML IJ SOLN: 4.0000 mg | Freq: Four times a day (QID) | INTRAMUSCULAR | Status: DC | PRN

## 2018-12-24 MED ORDER — CEFAZOLIN SODIUM-DEXTROSE 2-4 GM/100ML-% IV SOLN
2.0000 g | INTRAVENOUS | Status: AC
  Administered 2018-12-24: 2 g via INTRAVENOUS

## 2018-12-24 MED ORDER — ASPIRIN 81 MG PO CHEW
81.0000 mg | CHEWABLE_TABLET | Freq: Two times a day (BID) | ORAL | Status: DC
  Administered 2018-12-24 – 2018-12-25 (×2): 81 mg via ORAL
  Filled 2018-12-24 (×2): qty 1

## 2018-12-24 MED ORDER — PROPOFOL 10 MG/ML IV BOLUS
INTRAVENOUS | Status: AC
  Filled 2018-12-24: qty 40

## 2018-12-24 MED ORDER — PROPOFOL 500 MG/50ML IV EMUL
INTRAVENOUS | Status: DC | PRN
  Administered 2018-12-24: 75 ug/kg/min via INTRAVENOUS

## 2018-12-24 MED ORDER — HYDROMORPHONE HCL 1 MG/ML IJ SOLN
0.5000 mg | INTRAMUSCULAR | Status: DC | PRN
  Administered 2018-12-24: 1 mg via INTRAVENOUS
  Filled 2018-12-24: qty 1

## 2018-12-24 MED ORDER — PHENOL 1.4 % MT LIQD
1.0000 | OROMUCOSAL | Status: DC | PRN
  Filled 2018-12-24: qty 177

## 2018-12-24 MED ORDER — METHOCARBAMOL 500 MG IVPB - SIMPLE MED
500.0000 mg | Freq: Four times a day (QID) | INTRAVENOUS | Status: DC | PRN
  Administered 2018-12-24: 500 mg via INTRAVENOUS
  Filled 2018-12-24: qty 50

## 2018-12-24 MED ORDER — BUPIVACAINE-EPINEPHRINE 0.25% -1:200000 IJ SOLN
INTRAMUSCULAR | Status: DC | PRN
  Administered 2018-12-24: 30 mL

## 2018-12-24 MED ORDER — PHENYLEPHRINE HCL (PRESSORS) 10 MG/ML IV SOLN
INTRAVENOUS | Status: AC
  Filled 2018-12-24: qty 1

## 2018-12-24 MED ORDER — POVIDONE-IODINE 10 % EX SWAB: 2.0000 "application " | Freq: Once | CUTANEOUS | Status: DC

## 2018-12-24 MED ORDER — MEPERIDINE HCL 50 MG/ML IJ SOLN: 6.2500 mg | INTRAMUSCULAR | Status: DC | PRN

## 2018-12-24 MED ORDER — CHLORHEXIDINE GLUCONATE 4 % EX LIQD: 60.0000 mL | Freq: Once | CUTANEOUS | Status: DC

## 2018-12-24 MED ORDER — DEXAMETHASONE SODIUM PHOSPHATE 10 MG/ML IJ SOLN
10.0000 mg | Freq: Once | INTRAMUSCULAR | Status: AC
  Administered 2018-12-25: 08:00:00 10 mg via INTRAVENOUS
  Filled 2018-12-24: qty 1

## 2018-12-24 MED ORDER — DOCUSATE SODIUM 100 MG PO CAPS
100.0000 mg | ORAL_CAPSULE | Freq: Two times a day (BID) | ORAL | Status: DC
  Administered 2018-12-24: 100 mg via ORAL
  Filled 2018-12-24: qty 1

## 2018-12-24 MED ORDER — DIPHENHYDRAMINE HCL 12.5 MG/5ML PO ELIX: 12.5000 mg | ORAL_SOLUTION | ORAL | Status: DC | PRN

## 2018-12-24 MED ORDER — ATORVASTATIN CALCIUM 20 MG PO TABS
20.0000 mg | ORAL_TABLET | Freq: Every evening | ORAL | Status: DC
  Administered 2018-12-24: 20 mg via ORAL
  Filled 2018-12-24: qty 1

## 2018-12-24 MED ORDER — HYDROMORPHONE HCL 2 MG PO TABS
ORAL_TABLET | ORAL | Status: AC
  Administered 2018-12-24: 2 mg via ORAL
  Filled 2018-12-24: qty 1

## 2018-12-24 MED ORDER — LORATADINE 10 MG PO TABS: 10.0000 mg | ORAL_TABLET | Freq: Every day | ORAL | Status: DC | PRN

## 2018-12-24 MED ORDER — PROMETHAZINE HCL 25 MG/ML IJ SOLN: 6.2500 mg | INTRAMUSCULAR | Status: DC | PRN

## 2018-12-24 MED ORDER — EPHEDRINE 5 MG/ML INJ
INTRAVENOUS | Status: AC
  Filled 2018-12-24: qty 10

## 2018-12-24 MED ORDER — SODIUM CHLORIDE 0.9 % IV SOLN
INTRAVENOUS | Status: DC | PRN
  Administered 2018-12-24: 25 ug/min via INTRAVENOUS

## 2018-12-24 MED ORDER — PROPOFOL 10 MG/ML IV BOLUS
INTRAVENOUS | Status: DC | PRN
  Administered 2018-12-24 (×2): 20 mg via INTRAVENOUS

## 2018-12-24 MED ORDER — HYDROXYZINE HCL 25 MG PO TABS
25.0000 mg | ORAL_TABLET | Freq: Three times a day (TID) | ORAL | Status: DC | PRN
  Administered 2018-12-24 – 2018-12-25 (×2): 25 mg via ORAL
  Filled 2018-12-24 (×3): qty 1

## 2018-12-24 MED ORDER — HYDROMORPHONE HCL 2 MG PO TABS
2.0000 mg | ORAL_TABLET | ORAL | Status: DC | PRN
  Administered 2018-12-24 – 2018-12-25 (×5): 2 mg via ORAL
  Filled 2018-12-24 (×4): qty 1

## 2018-12-24 MED ORDER — METHOCARBAMOL 500 MG PO TABS
500.0000 mg | ORAL_TABLET | Freq: Four times a day (QID) | ORAL | Status: DC | PRN
  Administered 2018-12-24 – 2018-12-25 (×2): 500 mg via ORAL
  Filled 2018-12-24 (×2): qty 1

## 2018-12-24 MED ORDER — ACETAMINOPHEN 325 MG PO TABS: 325.0000 mg | ORAL_TABLET | Freq: Once | ORAL | Status: DC | PRN

## 2018-12-24 MED ORDER — SENNA 8.6 MG PO TABS
1.0000 | ORAL_TABLET | Freq: Two times a day (BID) | ORAL | Status: DC
  Administered 2018-12-24: 8.6 mg via ORAL
  Filled 2018-12-24: qty 1

## 2018-12-24 MED ORDER — EPHEDRINE SULFATE-NACL 50-0.9 MG/10ML-% IV SOSY
PREFILLED_SYRINGE | INTRAVENOUS | Status: DC | PRN
  Administered 2018-12-24 (×2): 10 mg via INTRAVENOUS

## 2018-12-24 MED ORDER — LEVOTHYROXINE SODIUM 75 MCG PO TABS
75.0000 ug | ORAL_TABLET | Freq: Every day | ORAL | Status: DC
  Administered 2018-12-25: 75 ug via ORAL
  Filled 2018-12-24: qty 1

## 2018-12-24 MED ORDER — ACETAMINOPHEN 10 MG/ML IV SOLN: 1000.0000 mg | Freq: Once | INTRAVENOUS | Status: DC | PRN

## 2018-12-24 MED ORDER — ISOPROPYL ALCOHOL 70 % SOLN
Status: DC | PRN
  Administered 2018-12-24: 1 via TOPICAL

## 2018-12-24 MED ORDER — ONDANSETRON HCL 4 MG PO TABS: 4.0000 mg | ORAL_TABLET | Freq: Four times a day (QID) | ORAL | Status: DC | PRN

## 2018-12-24 MED ORDER — MIDAZOLAM HCL 2 MG/2ML IJ SOLN
INTRAMUSCULAR | Status: AC
  Filled 2018-12-24: qty 2

## 2018-12-24 MED ORDER — TRANEXAMIC ACID-NACL 1000-0.7 MG/100ML-% IV SOLN: 1000.0000 mg | INTRAVENOUS | Status: DC

## 2018-12-24 MED ORDER — DEXAMETHASONE SODIUM PHOSPHATE 10 MG/ML IJ SOLN
INTRAMUSCULAR | Status: AC
  Filled 2018-12-24: qty 1

## 2018-12-24 MED ORDER — OXYBUTYNIN CHLORIDE ER 5 MG PO TB24
5.0000 mg | ORAL_TABLET | Freq: Every day | ORAL | Status: DC
  Administered 2018-12-24: 5 mg via ORAL
  Filled 2018-12-24: qty 1

## 2018-12-24 MED ORDER — TRANEXAMIC ACID-NACL 1000-0.7 MG/100ML-% IV SOLN
1000.0000 mg | INTRAVENOUS | Status: AC
  Administered 2018-12-24: 1000 mg via INTRAVENOUS

## 2018-12-24 MED ORDER — METOCLOPRAMIDE HCL 5 MG/ML IJ SOLN: 5.0000 mg | Freq: Three times a day (TID) | INTRAMUSCULAR | Status: DC | PRN

## 2018-12-24 MED ORDER — KETOROLAC TROMETHAMINE 15 MG/ML IJ SOLN
INTRAMUSCULAR | Status: AC
  Administered 2018-12-24: 12:00:00 15 mg via INTRAVENOUS
  Filled 2018-12-24: qty 1

## 2018-12-24 MED ORDER — METOCLOPRAMIDE HCL 5 MG PO TABS: 5.0000 mg | ORAL_TABLET | Freq: Three times a day (TID) | ORAL | Status: DC | PRN

## 2018-12-24 MED ORDER — KETOROLAC TROMETHAMINE 30 MG/ML IJ SOLN
INTRAMUSCULAR | Status: AC
  Filled 2018-12-24: qty 1

## 2018-12-24 MED ORDER — TRANEXAMIC ACID-NACL 1000-0.7 MG/100ML-% IV SOLN
INTRAVENOUS | Status: AC
  Filled 2018-12-24: qty 100

## 2018-12-24 MED ORDER — WATER FOR IRRIGATION, STERILE IR SOLN
Status: DC | PRN
  Administered 2018-12-24 (×2): 1000 mL

## 2018-12-24 MED ORDER — DEXAMETHASONE SODIUM PHOSPHATE 10 MG/ML IJ SOLN
INTRAMUSCULAR | Status: DC | PRN
  Administered 2018-12-24: 10 mg via INTRAVENOUS

## 2018-12-24 MED ORDER — SODIUM CHLORIDE 0.9 % IR SOLN
Status: DC | PRN
  Administered 2018-12-24: 3000 mL
  Administered 2018-12-24: 1000 mL

## 2018-12-24 MED ORDER — FESOTERODINE FUMARATE ER 8 MG PO TB24: 8.0000 mg | ORAL_TABLET | Freq: Every day | ORAL | Status: DC

## 2018-12-24 MED ORDER — HYDROMORPHONE HCL 1 MG/ML IJ SOLN
0.2500 mg | INTRAMUSCULAR | Status: DC | PRN
  Administered 2018-12-24 (×3): 0.25 mg via INTRAVENOUS
  Administered 2018-12-24: 12:00:00 0.5 mg via INTRAVENOUS
  Administered 2018-12-24: 0.25 mg via INTRAVENOUS
  Administered 2018-12-24: 0.5 mg via INTRAVENOUS

## 2018-12-24 MED ORDER — POVIDONE-IODINE 10 % EX OINT
TOPICAL_OINTMENT | CUTANEOUS | Status: DC | PRN
  Filled 2018-12-24: qty 28.35

## 2018-12-24 MED ORDER — ALUM & MAG HYDROXIDE-SIMETH 200-200-20 MG/5ML PO SUSP: 30.0000 mL | ORAL | Status: DC | PRN

## 2018-12-24 MED ORDER — GABAPENTIN 300 MG PO CAPS
600.0000 mg | ORAL_CAPSULE | Freq: Two times a day (BID) | ORAL | Status: DC
  Administered 2018-12-24 – 2018-12-25 (×2): 600 mg via ORAL
  Filled 2018-12-24 (×2): qty 2

## 2018-12-24 MED ORDER — ISOPROPYL ALCOHOL 70 % SOLN
Status: AC
  Filled 2018-12-24: qty 480

## 2018-12-24 MED ORDER — BUPIVACAINE IN DEXTROSE 0.75-8.25 % IT SOLN
INTRATHECAL | Status: DC | PRN
  Administered 2018-12-24: 1.8 mL via INTRATHECAL

## 2018-12-24 MED ORDER — METHOCARBAMOL 500 MG IVPB - SIMPLE MED
INTRAVENOUS | Status: AC
  Administered 2018-12-24: 11:00:00 500 mg via INTRAVENOUS
  Filled 2018-12-24: qty 50

## 2018-12-24 MED ORDER — KETOROLAC TROMETHAMINE 30 MG/ML IJ SOLN
INTRAMUSCULAR | Status: DC | PRN
  Administered 2018-12-24: 30 mg

## 2018-12-24 MED ORDER — SODIUM CHLORIDE 0.9 % IR SOLN
Status: DC | PRN
  Administered 2018-12-24: 1000 mL

## 2018-12-24 MED ORDER — MIDAZOLAM HCL 5 MG/5ML IJ SOLN
INTRAMUSCULAR | Status: DC | PRN
  Administered 2018-12-24: 2 mg via INTRAVENOUS

## 2018-12-24 MED ORDER — ACETAMINOPHEN 10 MG/ML IV SOLN
1000.0000 mg | INTRAVENOUS | Status: AC
  Administered 2018-12-24: 1000 mg via INTRAVENOUS
  Filled 2018-12-24: qty 100

## 2018-12-24 MED ORDER — FENTANYL CITRATE (PF) 100 MCG/2ML IJ SOLN
INTRAMUSCULAR | Status: DC | PRN
  Administered 2018-12-24: 100 ug via INTRAVENOUS

## 2018-12-24 MED ORDER — CEFAZOLIN SODIUM-DEXTROSE 2-4 GM/100ML-% IV SOLN
2.0000 g | Freq: Once | INTRAVENOUS | Status: DC
  Filled 2018-12-24: qty 100

## 2018-12-24 MED ORDER — FENTANYL CITRATE (PF) 100 MCG/2ML IJ SOLN
INTRAMUSCULAR | Status: AC
  Filled 2018-12-24: qty 2

## 2018-12-24 MED ORDER — SODIUM CHLORIDE (PF) 0.9 % IJ SOLN
INTRAMUSCULAR | Status: AC
  Filled 2018-12-24: qty 50

## 2018-12-24 MED ORDER — POLYETHYLENE GLYCOL 3350 17 G PO PACK: 17.0000 g | PACK | Freq: Every day | ORAL | Status: DC | PRN

## 2018-12-24 MED ORDER — SODIUM CHLORIDE (PF) 0.9 % IJ SOLN
INTRAMUSCULAR | Status: DC | PRN
  Administered 2018-12-24: 30 mL

## 2018-12-24 MED ORDER — CYCLOSPORINE 0.05 % OP EMUL
1.0000 [drp] | Freq: Two times a day (BID) | OPHTHALMIC | Status: DC
  Administered 2018-12-24 – 2018-12-25 (×2): 1 [drp] via OPHTHALMIC
  Filled 2018-12-24 (×2): qty 30

## 2018-12-24 MED ORDER — VENLAFAXINE HCL ER 150 MG PO CP24
150.0000 mg | ORAL_CAPSULE | Freq: Every evening | ORAL | Status: DC
  Administered 2018-12-24: 150 mg via ORAL
  Filled 2018-12-24: qty 1

## 2018-12-24 MED ORDER — TRIAMTERENE-HCTZ 37.5-25 MG PO TABS
1.0000 | ORAL_TABLET | Freq: Every morning | ORAL | Status: DC
  Administered 2018-12-25: 1 via ORAL
  Filled 2018-12-24: qty 1

## 2018-12-24 SURGICAL SUPPLY — 66 items
ADH SKN CLS APL DERMABOND .7 (GAUZE/BANDAGES/DRESSINGS) ×1
APL PRP STRL LF DISP 70% ISPRP (MISCELLANEOUS) ×1
BAG DECANTER FOR FLEXI CONT (MISCELLANEOUS) IMPLANT
BAG SPEC THK2 15X12 ZIP CLS (MISCELLANEOUS)
BAG ZIPLOCK 12X15 (MISCELLANEOUS) IMPLANT
BALL HIP CERAMIC (Hips) IMPLANT
BLADE SURG SZ10 CARB STEEL (BLADE) ×4 IMPLANT
CHLORAPREP W/TINT 26 (MISCELLANEOUS) ×2 IMPLANT
CLOTH BEACON ORANGE TIMEOUT ST (SAFETY) ×2 IMPLANT
COVER PERINEAL POST (MISCELLANEOUS) ×2 IMPLANT
COVER SURGICAL LIGHT HANDLE (MISCELLANEOUS) ×2 IMPLANT
COVER WAND RF STERILE (DRAPES) IMPLANT
CUP SECTOR GRIPTON 50MM (Cup) ×1 IMPLANT
DECANTER SPIKE VIAL GLASS SM (MISCELLANEOUS) ×2 IMPLANT
DERMABOND ADVANCED (GAUZE/BANDAGES/DRESSINGS) ×1
DERMABOND ADVANCED .7 DNX12 (GAUZE/BANDAGES/DRESSINGS) ×2 IMPLANT
DRAPE IMP U-DRAPE 54X76 (DRAPES) ×2 IMPLANT
DRAPE SHEET LG 3/4 BI-LAMINATE (DRAPES) ×6 IMPLANT
DRAPE STERI IOBAN 125X83 (DRAPES) ×2 IMPLANT
DRAPE U-SHAPE 47X51 STRL (DRAPES) ×4 IMPLANT
DRESSING AQUACEL AG SP 3.5X10 (GAUZE/BANDAGES/DRESSINGS) IMPLANT
DRSG AQUACEL AG ADV 3.5X10 (GAUZE/BANDAGES/DRESSINGS) ×2 IMPLANT
DRSG AQUACEL AG SP 3.5X10 (GAUZE/BANDAGES/DRESSINGS) ×2
ELECT BLADE TIP CTD 4 INCH (ELECTRODE) ×2 IMPLANT
ELECT PENCIL ROCKER SW 15FT (MISCELLANEOUS) ×2 IMPLANT
ELECT REM PT RETURN 15FT ADLT (MISCELLANEOUS) ×2 IMPLANT
GAUZE SPONGE 4X4 12PLY STRL (GAUZE/BANDAGES/DRESSINGS) ×2 IMPLANT
GLOVE BIO SURGEON STRL SZ8.5 (GLOVE) ×4 IMPLANT
GLOVE BIOGEL PI IND STRL 8.5 (GLOVE) ×1 IMPLANT
GLOVE BIOGEL PI INDICATOR 8.5 (GLOVE) ×1
GOWN SPEC L3 XXLG W/TWL (GOWN DISPOSABLE) ×2 IMPLANT
HANDPIECE INTERPULSE COAX TIP (DISPOSABLE) ×2
HIP BALL CERAMIC (Hips) ×2 IMPLANT
HOLDER FOLEY CATH W/STRAP (MISCELLANEOUS) ×2 IMPLANT
HOOD PEEL AWAY FLYTE STAYCOOL (MISCELLANEOUS) ×8 IMPLANT
JET LAVAGE IRRISEPT WOUND (IRRIGATION / IRRIGATOR) ×4
KIT TURNOVER KIT A (KITS) IMPLANT
LAVAGE JET IRRISEPT WOUND (IRRIGATION / IRRIGATOR) ×1 IMPLANT
LINER ACETABULAR 32X50 (Liner) ×1 IMPLANT
MANIFOLD NEPTUNE II (INSTRUMENTS) ×2 IMPLANT
MARKER SKIN DUAL TIP RULER LAB (MISCELLANEOUS) ×2 IMPLANT
NDL SAFETY ECLIPSE 18X1.5 (NEEDLE) ×1 IMPLANT
NDL SPNL 18GX3.5 QUINCKE PK (NEEDLE) ×1 IMPLANT
NEEDLE HYPO 18GX1.5 SHARP (NEEDLE) ×2
NEEDLE SPNL 18GX3.5 QUINCKE PK (NEEDLE) ×2 IMPLANT
PACK ANTERIOR HIP CUSTOM (KITS) ×2 IMPLANT
SAW OSC TIP CART 19.5X105X1.3 (SAW) ×2 IMPLANT
SCREW 6.5MMX35MM (Screw) ×1 IMPLANT
SEALER BIPOLAR AQUA 6.0 (INSTRUMENTS) ×2 IMPLANT
SET HNDPC FAN SPRY TIP SCT (DISPOSABLE) ×1 IMPLANT
STEM TRI LOC BPS SZ3 W GRIPTON IMPLANT
SUT ETHIBOND NAB CT1 #1 30IN (SUTURE) ×4 IMPLANT
SUT MNCRL AB 3-0 PS2 18 (SUTURE) ×2 IMPLANT
SUT MNCRL AB 4-0 PS2 18 (SUTURE) ×2 IMPLANT
SUT MON AB 2-0 CT1 36 (SUTURE) ×4 IMPLANT
SUT STRATAFIX PDO 1 14 VIOLET (SUTURE) ×2
SUT STRATFX PDO 1 14 VIOLET (SUTURE) ×1
SUT VIC AB 2-0 CT1 27 (SUTURE) ×2
SUT VIC AB 2-0 CT1 TAPERPNT 27 (SUTURE) ×1 IMPLANT
SUTURE STRATFX PDO 1 14 VIOLET (SUTURE) ×1 IMPLANT
SYR 3ML LL SCALE MARK (SYRINGE) ×4 IMPLANT
SYR 50ML LL SCALE MARK (SYRINGE) ×2 IMPLANT
TRAY FOLEY MTR SLVR 16FR STAT (SET/KITS/TRAYS/PACK) IMPLANT
TRI LOC BPS SZ 3 W GRIPTON ×2 IMPLANT
WATER STERILE IRR 1000ML POUR (IV SOLUTION) ×2 IMPLANT
YANKAUER SUCT BULB TIP 10FT TU (MISCELLANEOUS) ×2 IMPLANT

## 2018-12-24 NOTE — Op Note (Signed)
OPERATIVE REPORT  SURGEON: Rod Can, MD   ASSISTANT: Nehemiah Massed, PA-C.  PREOPERATIVE DIAGNOSIS: Left hip arthritis.   POSTOPERATIVE DIAGNOSIS: Left hip arthritis.   PROCEDURE: Left total hip arthroplasty, anterior approach.   IMPLANTS: DePuy Tri Lock stem, size 3, hi offset. DePuy Pinnacle Cup, size 50 mm. DePuy Altrx liner, size 32 by 50 mm, neutral. DePuy Biolox ceramic head ball, size 32 + 5 mm. 6.5 mm cancellous bone screw x1.  ANESTHESIA:  Spinal  ESTIMATED BLOOD LOSS:-300 mL    ANTIBIOTICS: 2 g Ancef.  DRAINS: None.  COMPLICATIONS: None.   CONDITION: PACU - hemodynamically stable.   BRIEF CLINICAL NOTE: [name] is a 63 y.o. female with a long-standing history of Left hip arthritis. After failing conservative management, the patient was indicated for total hip arthroplasty. The risks, benefits, and alternatives to the procedure were explained, and the patient elected to proceed.  PROCEDURE IN DETAIL: Surgical site was marked by myself in the pre-op holding area. Once inside the operating room, spinal anesthesia was obtained, and a foley catheter was inserted. The patient was then positioned on the Hana table. All bony prominences were well padded. The hip was prepped and draped in the normal sterile surgical fashion. A time-out was called verifying side and site of surgery. The patient received IV antibiotics within 60 minutes of beginning the procedure.  The direct anterior approach to the hip was performed through the Hueter interval. Lateral femoral circumflex vessels were treated with the Auqumantys. The anterior capsule was exposed and an inverted T capsulotomy was made.The femoral neck cut was made to the level of the templated cut. A corkscrew was placed into the head and the head was removed. The femoral head was found to have eburnated bone. The head was passed to the back table and was measured.  Acetabular exposure was achieved, and  the pulvinar and labrum were excised. Sequential reaming of the acetabulum was then performed up to a size 49 mm reamer. A 50 mm cup was then opened and impacted into place at approximately 40 degrees of abduction and 20 degrees of anteversion. I elected to augment the already acceptable press fit fixation with a single 6.5 mm cancellous bone screw. The final polyethylene liner was impacted into place and acetabular osteophytes were removed.   I then gained femoral exposure taking care to protect the abductors and greater trochanter. This was performed using standard external rotation, extension, and adduction. The capsule was peeled off the inner aspect of the greater trochanter, taking care to preserve the short external rotators. A cookie cutter was used to enter the femoral canal, and then the femoral canal finder was placed. Sequential broaching was performed up to a size 3. Calcar planer was used on the femoral neck remnant. I placed a hi offset neck and a trial head ball. The hip was reduced. Leg lengths and offset were checked fluoroscopically. The hip was dislocated and trial components were removed. The final implants were placed, and the hip was reduced.  Fluoroscopy was used to confirm component position and leg lengths. At 90 degrees of external rotation and full extension, the hip was stable to an anterior directed force.  The wound was copiously irrigated with normal saline using pulse lavage. Marcaine solution was injected into the periarticular soft tissue. The wound was closed in layers using #1 Vicryl and V-Loc for the fascia, 2-0 Vicryl for the subcutaneous fat, 2-0 Monocryl for the deep dermal layer, 3-0 running Monocryl subcuticular stitch, and  Dermabond for the skin. Once the glue was fully dried, an Aquacell Ag dressing was applied. The patient was transported to the recovery room in stable condition. Sponge, needle, and instrument counts were correct at the end of the  case x2. The patient tolerated the procedure well and there were no known complications.  Please note that a surgical assistant was a medical necessity for this procedure to perform it in a safe and expeditious manner. Assistant was necessary to provide appropriate retraction of vital neurovascular structures, to prevent femoral fracture, and to allow for anatomic placement of the prosthesis.

## 2018-12-24 NOTE — Anesthesia Procedure Notes (Signed)
Spinal  Patient location during procedure: OR Start time: 12/24/2018 7:41 AM End time: 12/24/2018 7:46 AM Staffing Resident/CRNA: Lollie Sails, CRNA Performed: resident/CRNA  Preanesthetic Checklist Completed: patient identified, site marked, surgical consent, pre-op evaluation, timeout performed, IV checked, risks and benefits discussed and monitors and equipment checked Spinal Block Patient position: sitting Prep: site prepped and draped and DuraPrep Patient monitoring: heart rate, continuous pulse ox, blood pressure and cardiac monitor Approach: midline Location: L2-3 Injection technique: single-shot Needle Needle type: Sprotte  Needle gauge: 24 G Needle length: 10 cm Additional Notes Expiration date on kit noted and within range.  Good CSF flow noted with no heme or c/o paresthesia.   Patient tolerated well.

## 2018-12-24 NOTE — Discharge Instructions (Signed)
°Dr. Paralee Pendergrass °Joint Replacement Specialist °[facility] °[address] °, [place] [address] °[phone] ° ° °TOTAL HIP REPLACEMENT POSTOPERATIVE DIRECTIONS ° ° ° °Hip Rehabilitation, Guidelines Following Surgery  ° °WEIGHT BEARING °Weight bearing as tolerated with assist device (walker, cane, etc) as directed, use it as long as suggested by your surgeon or therapist, typically at least 4-6 weeks. ° °The results of a hip operation are greatly improved after range of motion and muscle strengthening exercises. Follow all safety measures which are given to protect your hip. If any of these exercises cause increased pain or swelling in your joint, decrease the amount until you are comfortable again. Then slowly increase the exercises. Call your caregiver if you have problems or questions.  ° °HOME CARE INSTRUCTIONS  °Most of the following instructions are designed to prevent the dislocation of your new hip.  °Remove items at home which could result in a fall. This includes throw rugs or furniture in walking pathways.  °Continue medications as instructed at time of discharge. °· You may have some home medications which will be placed on hold until you complete the course of blood thinner medication. °· You may start showering once you are discharged home. Do not remove your dressing. °Do not put on socks or shoes without following the instructions of your caregivers.   °Sit on chairs with arms. Use the chair arms to help push yourself up when arising.  °Arrange for the use of a toilet seat elevator so you are not sitting low.  °· Walk with walker as instructed.  °You may resume a sexual relationship in one month or when given the OK by your caregiver.  °Use walker as long as suggested by your caregivers.  °You may put full weight on your legs and walk as much as is comfortable. °Avoid periods of inactivity such as sitting longer than an hour when not asleep. This helps prevent  blood clots.  °You may return to work once you are cleared by your surgeon.  °Do not drive a car for 6 weeks or until released by your surgeon.  °Do not drive while taking narcotics.  °Wear elastic stockings for two weeks following surgery during the day but you may remove then at night.  °Make sure you keep all of your appointments after your operation with all of your doctors and caregivers. You should call the office at the above phone number and make an appointment for approximately two weeks after the date of your surgery. °Please pick up a stool softener and laxative for home use as long as you are requiring pain medications. °· ICE to the affected hip every three hours for 30 minutes at a time and then as needed for pain and swelling. Continue to use ice on the hip for pain and swelling from surgery. You may notice swelling that will progress down to the foot and ankle.  This is normal after surgery.  Elevate the leg when you are not up walking on it.   °It is important for you to complete the blood thinner medication as prescribed by your doctor. °· Continue to use the breathing machine which will help keep your temperature down.  It is common for your temperature to cycle up and down following surgery, especially at night when you are not up moving around and exerting yourself.  The breathing machine keeps your lungs expanded and your temperature down. ° °RANGE OF MOTION AND STRENGTHENING EXERCISES  °These exercises are   designed to help you keep full movement of your hip joint. Follow your caregiver's or physical therapist's instructions. Perform all exercises about fifteen times, three times per day or as directed. Exercise both hips, even if you have had only one joint replacement. These exercises can be done on a training (exercise) mat, on the floor, on a table or on a bed. Use whatever works the best and is most comfortable for you. Use music or television while you are exercising so that the exercises  are a pleasant break in your day. This will make your life better with the exercises acting as a break in routine you can look forward to.  °Lying on your back, slowly slide your foot toward your buttocks, raising your knee up off the floor. Then slowly slide your foot back down until your leg is straight again.  °Lying on your back spread your legs as far apart as you can without causing discomfort.  °Lying on your side, raise your upper leg and foot straight up from the floor as far as is comfortable. Slowly lower the leg and repeat.  °Lying on your back, tighten up the muscle in the front of your thigh (quadriceps muscles). You can do this by keeping your leg straight and trying to raise your heel off the floor. This helps strengthen the largest muscle supporting your knee.  °Lying on your back, tighten up the muscles of your buttocks both with the legs straight and with the knee bent at a comfortable angle while keeping your heel on the floor.  ° °SKILLED REHAB INSTRUCTIONS: °If the patient is transferred to a skilled rehab facility following release from the hospital, a list of the current medications will be sent to the facility for the patient to continue.  When discharged from the skilled rehab facility, please have the facility set up the patient's Home Health Physical Therapy prior to being released. Also, the skilled facility will be responsible for providing the patient with their medications at time of release from the facility to include their pain medication and their blood thinner medication. If the patient is still at the rehab facility at time of the two week follow up appointment, the skilled rehab facility will also need to assist the patient in arranging follow up appointment in our office and any transportation needs. ° °MAKE SURE YOU:  °Understand these instructions.  °Will watch your condition.  °Will get help right away if you are not doing well or get worse. ° °Pick up stool softner and  laxative for home use following surgery while on pain medications. °Do not remove your dressing. °The dressing is waterproof--it is OK to take showers. °Continue to use ice for pain and swelling after surgery. °Do not use any lotions or creams on the incision until instructed by your surgeon. °Total Hip Protocol. ° ° °

## 2018-12-24 NOTE — Anesthesia Postprocedure Evaluation (Signed)
Anesthesia Post Note  Patient: [name]  Procedure(s) Performed: TOTAL HIP ARTHROPLASTY ANTERIOR APPROACH (Left Hip)     Patient location during evaluation: PACU Anesthesia Type: Spinal Level of consciousness: oriented and awake and alert Pain management: pain level controlled Vital Signs Assessment: post-procedure vital signs reviewed and stable Respiratory status: spontaneous breathing, respiratory function stable and patient connected to nasal cannula oxygen Cardiovascular status: blood pressure returned to baseline and stable Postop Assessment: no headache, no backache and no apparent nausea or vomiting Anesthetic complications: no    Last Vitals:  Vitals:   12/24/18 1230 12/24/18 1245  BP: (!) 129/57 131/67  Pulse: 86 81  Resp: (!) 23 18  Temp: 37 C   SpO2: 100% 100%    Last Pain:  Vitals:   12/24/18 1230  TempSrc:   PainSc: [place]

## 2018-12-24 NOTE — Anesthesia Preprocedure Evaluation (Addendum)
Anesthesia Evaluation  Patient identified by MRN, date of birth, ID band Patient awake    Reviewed: Allergy & Precautions, NPO status , Patient's Chart, lab work & pertinent test results  History of Anesthesia Complications (+) PONV  Airway Mallampati: II  TM Distance: >3 FB Neck ROM: Full    Dental  (+) Teeth Intact, Dental Advisory Given   Pulmonary    breath sounds clear to auscultation       Cardiovascular hypertension,  Rhythm:Regular Rate:Normal     Neuro/Psych Depression  Neuromuscular disease    GI/Hepatic negative GI ROS, Neg liver ROS,   Endo/Other  Hypothyroidism   Renal/GU negative Renal ROS     Musculoskeletal  (+) Arthritis , Fibromyalgia -  Abdominal Normal abdominal exam  (+)   Peds  Hematology negative hematology ROS (+)   Anesthesia Other Findings   Reproductive/Obstetrics                            Lab Results  Component Value Date   WBC 7.2 12/18/2018   HGB 14.6 12/18/2018   HCT 45.1 12/18/2018   MCV 92.0 12/18/2018   PLT 332 12/18/2018   Lab Results  Component Value Date   INR 0.98 02/19/2011     Anesthesia Physical Anesthesia Plan  ASA: II  Anesthesia Plan: Spinal   Post-op Pain Management:    Induction: Intravenous  PONV Risk Score and Plan: 4 or greater and Ondansetron, Dexamethasone, Midazolam, Treatment may vary due to age or medical condition and Propofol infusion  Airway Management Planned: Natural Airway and Simple Face Mask  Additional Equipment: None  Intra-op Plan:   Post-operative Plan:   Informed Consent: I have reviewed the patients History and Physical, chart, labs and discussed the procedure including the risks, benefits and alternatives for the proposed anesthesia with the patient or authorized representative who has indicated his/her understanding and acceptance.       Plan Discussed with: CRNA  Anesthesia Plan  Comments:        Anesthesia Quick Evaluation

## 2018-12-24 NOTE — Transfer of Care (Signed)
Immediate Anesthesia Transfer of Care Note  Patient: [name]  Procedure(s) Performed: TOTAL HIP ARTHROPLASTY ANTERIOR APPROACH (Left Hip)  Patient Location: PACU  Anesthesia Type:Spinal  Level of Consciousness: sedated and responds to stimulation  Airway & Oxygen Therapy: Patient Spontanous Breathing and Patient connected to face mask oxygen  Post-op Assessment: Report given to RN and Post -op Vital signs reviewed and stable  Post vital signs: Reviewed and stable  Last Vitals:  Vitals Value Taken Time  BP 104/44 12/24/18 1030  Temp    Pulse 64 12/24/18 1031  Resp 13 12/24/18 1031  SpO2 100 % 12/24/18 1031  Vitals shown include unvalidated device data.  Last Pain:  Vitals:   12/24/18 0647  TempSrc:   PainSc: 10-Worst pain ever      Patients Stated Pain Goal: 9 (80/32/12 2482)  Complications: No apparent anesthesia complications

## 2018-12-24 NOTE — Interval H&P Note (Signed)
History and Physical Interval Note:  12/24/2018 7:28 AM  [name]  has presented today for surgery, with the diagnosis of Degenerative joint disease.  The various methods of treatment have been discussed with the patient and family. After consideration of risks, benefits and other options for treatment, the patient has consented to  Procedure(s): TOTAL HIP ARTHROPLASTY ANTERIOR APPROACH (Left) as a surgical intervention.  The patient's history has been reviewed, patient examined, no change in status, stable for surgery.  I have reviewed the patient's chart and labs.  Questions were answered to the patient's satisfaction.     Hilton Cork Frenchie Dangerfield

## 2018-12-24 NOTE — Evaluation (Signed)
Physical Therapy Evaluation Patient Details Name: [name] MRN: [id number] DOB: 10-14-1955 Today's Date: 12/24/2018   History of Present Illness  s/p L DA THA; Hx back surgery, HTN  Clinical Impression  Pt is s/p THA resulting in the deficits listed below (see PT Problem List).  Pt with multiple questions regarding POC, pain meds, DME etc.  Pt states she has a standard walker, she may need RW for home. Pt amb 35' with RW and min assist, requiring cues for safety and sequence throughout; continue to  Follow.   Pt will benefit from skilled PT to increase their independence and safety with mobility to allow discharge to the venue listed below.      Follow Up Recommendations Follow surgeon's recommendation for DC plan and follow-up therapies    Equipment Recommendations  Rolling walker with 5" wheels    Recommendations for Other Services       Precautions / Restrictions Precautions Precautions: Fall Restrictions Weight Bearing Restrictions: No Other Position/Activity Restrictions: WBAT      Mobility  Bed Mobility Overal bed mobility: Needs Assistance Bed Mobility: Supine to Sit     Supine to sit: Min assist     General bed mobility comments: assist with LLE  Transfers Overall transfer level: Needs assistance Equipment used: Rolling walker (2 wheeled) Transfers: Stand Pivot Transfers;Sit to/from Stand Sit to Stand: Min assist         General transfer comment: cues for safety and hand placement  Ambulation/Gait Ambulation/Gait assistance: Min assist Gait Distance (Feet): 35 Feet Assistive device: Rolling walker (2 wheeled) Gait Pattern/deviations: Step-to pattern     General Gait Details: cues for sequence, RW position and safety  Stairs            Wheelchair Mobility    Modified Rankin (Stroke Patients Only)       Balance                                             Pertinent Vitals/Pain Pain Assessment: 0-10 Pain  Location: L hip Pain Descriptors / Indicators: Grimacing;Sore Pain Intervention(s): Limited activity within patient's tolerance;Monitored during session;Premedicated before session;Repositioned;Ice applied    Home Living Family/patient expects to be discharged to:: Private residence Living Arrangements: Spouse/significant other Available Help at Discharge: Family Type of Home: House Home Access: Stairs to enter Entrance Stairs-Rails: Doctor, general practiceight;Left Entrance Stairs-Number of Steps: 3 Home Layout: One level Home Equipment: Bedside commode;Shower seat;Cane - single point;Walker - standard Additional Comments: pt states she has her mother's standard walker    Prior Function Level of Independence: Independent         Comments: amb with cane or walker     Hand Dominance        Extremity/Trunk Assessment   Upper Extremity Assessment Upper Extremity Assessment: Overall WFL for tasks assessed    Lower Extremity Assessment Lower Extremity Assessment: LLE deficits/detail LLE Deficits / Details: AAROM WFL, knee and hip grossly 2+ to 3/5       Communication   Communication: No difficulties  Cognition Arousal/Alertness: Awake/alert Behavior During Therapy: WFL for tasks assessed/performed;Anxious Overall Cognitive Status: Within Functional Limits for tasks assessed                                        General Comments  Exercises Total Joint Exercises Ankle Circles/Pumps: AROM;10 reps;Both Quad Sets: AROM;5 reps;Both   Assessment/Plan    PT Assessment Patient needs continued PT services  PT Problem List Decreased strength;Decreased activity tolerance;Decreased balance;Pain;Decreased knowledge of use of DME;Decreased mobility;Decreased safety awareness       PT Treatment Interventions Gait training;DME instruction;Therapeutic exercise;Stair training;Functional mobility training;Therapeutic activities;Patient/family education    PT Goals (Current  goals can be found in the Care Plan section)  Acute Rehab PT Goals PT Goal Formulation: With patient Time For Goal Achievement: 12/31/18 Potential to Achieve Goals: Good    Frequency 7X/week   Barriers to discharge        Co-evaluation               AM-PAC PT "6 Clicks" Mobility  Outcome Measure Help needed turning from your back to your side while in a flat bed without using bedrails?: A Little Help needed moving from lying on your back to sitting on the side of a flat bed without using bedrails?: A Little Help needed moving to and from a bed to a chair (including a wheelchair)?: A Little Help needed standing up from a chair using your arms (e.g., wheelchair or bedside chair)?: A Little Help needed to walk in hospital room?: A Little Help needed climbing 3-5 steps with a railing? : A Lot 6 Click Score: 17    End of Session Equipment Utilized During Treatment: Gait belt Activity Tolerance: Patient tolerated treatment well;Patient limited by pain Patient left: in chair;with call bell/phone within reach;with chair alarm set Nurse Communication: Mobility status PT Visit Diagnosis: Difficulty in walking, not elsewhere classified (R26.2)    Time: 4709-6283 PT Time Calculation (min) (ACUTE ONLY): 18 min   Charges:   PT Evaluation $PT Eval Low Complexity: 1 Low          Kenyon Ana, PT  Pager: [phone] Acute Rehab Dept [facility]): [phone]   12/24/2018   [facility] 12/24/2018, 5:50 PM

## 2018-12-24 NOTE — Anesthesia Procedure Notes (Signed)
Date/Time: 12/24/2018 7:40 AM Performed by: Lollie Sails, CRNA Pre-anesthesia Checklist: Patient identified, Emergency Drugs available, Suction available, Patient being monitored and Timeout performed Oxygen Delivery Method: Simple face mask

## 2018-12-25 ENCOUNTER — Encounter (HOSPITAL_COMMUNITY): Payer: Self-pay | Admitting: Orthopedic Surgery

## 2018-12-25 DIAGNOSIS — F329 Major depressive disorder, single episode, unspecified: Secondary | ICD-10-CM | POA: Diagnosis not present

## 2018-12-25 DIAGNOSIS — M1612 Unilateral primary osteoarthritis, left hip: Secondary | ICD-10-CM | POA: Diagnosis not present

## 2018-12-25 DIAGNOSIS — M25852 Other specified joint disorders, left hip: Secondary | ICD-10-CM | POA: Diagnosis not present

## 2018-12-25 DIAGNOSIS — M8568 Other cyst of bone, other site: Secondary | ICD-10-CM | POA: Diagnosis not present

## 2018-12-25 DIAGNOSIS — G709 Myoneural disorder, unspecified: Secondary | ICD-10-CM | POA: Diagnosis not present

## 2018-12-25 DIAGNOSIS — N3010 Interstitial cystitis (chronic) without hematuria: Secondary | ICD-10-CM | POA: Diagnosis not present

## 2018-12-25 DIAGNOSIS — I10 Essential (primary) hypertension: Secondary | ICD-10-CM | POA: Diagnosis not present

## 2018-12-25 DIAGNOSIS — M797 Fibromyalgia: Secondary | ICD-10-CM | POA: Diagnosis not present

## 2018-12-25 DIAGNOSIS — E039 Hypothyroidism, unspecified: Secondary | ICD-10-CM | POA: Diagnosis not present

## 2018-12-25 DIAGNOSIS — Z79890 Hormone replacement therapy (postmenopausal): Secondary | ICD-10-CM | POA: Diagnosis not present

## 2018-12-25 DIAGNOSIS — Z79899 Other long term (current) drug therapy: Secondary | ICD-10-CM | POA: Diagnosis not present

## 2018-12-25 DIAGNOSIS — R2689 Other abnormalities of gait and mobility: Secondary | ICD-10-CM | POA: Diagnosis not present

## 2018-12-25 DIAGNOSIS — M25752 Osteophyte, left hip: Secondary | ICD-10-CM | POA: Diagnosis not present

## 2018-12-25 DIAGNOSIS — M24152 Other articular cartilage disorders, left hip: Secondary | ICD-10-CM | POA: Diagnosis not present

## 2018-12-25 LAB — BASIC METABOLIC PANEL
Anion gap: 9 (ref 5–15)
BUN: 18 mg/dL (ref 8–23)
CO2: 25 mmol/L (ref 22–32)
Calcium: 8 mg/dL — ABNORMAL LOW (ref 8.9–10.3)
Chloride: 104 mmol/L (ref 98–111)
Creatinine, Ser: 0.63 mg/dL (ref 0.44–1.00)
GFR calc Af Amer: >60 mL/min (ref >60)
GFR calc non Af Amer: >60 mL/min (ref >60)
Glucose, Bld: 139 mg/dL — ABNORMAL HIGH (ref 70–99)
Potassium: 4 mmol/L (ref 3.5–5.1)
Sodium: 138 mmol/L (ref 135–145)

## 2018-12-25 LAB — CBC
HCT: 34.3 % — ABNORMAL LOW (ref 36.0–46.0)
Hemoglobin: 10.9 g/dL — ABNORMAL LOW (ref 12.0–15.0)
MCH: 30.3 pg (ref 26.0–34.0)
MCHC: 31.8 g/dL (ref 30.0–36.0)
MCV: 95.3 fL (ref 80.0–100.0)
Platelets: 239 10*3/uL (ref 150–400)
RBC: 3.6 MIL/uL — ABNORMAL LOW (ref 3.87–5.11)
RDW: 13.1 % (ref 11.5–15.5)
WBC: 11 10*3/uL — ABNORMAL HIGH (ref 4.0–10.5)
nRBC: 0 % (ref 0.0–0.2)

## 2018-12-25 MED ORDER — POLYETHYLENE GLYCOL 3350 17 G PO PACK: 17.0000 g | PACK | Freq: Every day | ORAL | 0 refills | Status: DC | PRN

## 2018-12-25 MED ORDER — HYDROMORPHONE HCL 2 MG PO TABS: 2.0000 mg | ORAL_TABLET | ORAL | 0 refills | Status: DC | PRN

## 2018-12-25 MED ORDER — ASPIRIN 81 MG PO CHEW: 81.0000 mg | CHEWABLE_TABLET | Freq: Two times a day (BID) | ORAL | 1 refills | Status: DC

## 2018-12-25 MED ORDER — SENNA 8.6 MG PO TABS: 1.0000 | ORAL_TABLET | Freq: Two times a day (BID) | ORAL | 0 refills | Status: DC

## 2018-12-25 MED ORDER — ONDANSETRON HCL 4 MG PO TABS: 4.0000 mg | ORAL_TABLET | Freq: Four times a day (QID) | ORAL | 0 refills | Status: DC | PRN

## 2018-12-25 NOTE — Discharge Summary (Signed)
Physician Discharge Summary  Patient ID: [name] MRN: [id number] DOB/AGE: Jan 02, 1956 63 y.o.  Admit date: 12/24/2018 Discharge date: 12/25/2018  Admission Diagnoses:  Osteoarthritis of left hip  Discharge Diagnoses:  Principal Problem:   Osteoarthritis of left hip   Past Medical History:  Diagnosis Date  . Ankle fracture, right   . 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 ASCUS (atypical squamous cells of undetermined significance) on Pap smear 07/2005   NEG FOR HIGH RISK HPV  . Complication of anesthesia   . Depression   . Endometriosis   . Fibromyalgia    left shoulder  . Hypertension   . Hypothyroid   . IC (interstitial cystitis)   . Menopausal state   . PONV (postoperative nausea and vomiting)     Surgeries: Procedure(s): TOTAL HIP ARTHROPLASTY ANTERIOR APPROACH on 12/24/2018   Consultants (if any):   Discharged Condition: Improved  Hospital Course: [name] is an 63 y.o. female who was admitted 12/24/2018 with a diagnosis of Osteoarthritis of left hip and went to the operating room on 12/24/2018 and underwent the above named procedures.    She was given perioperative antibiotics:  Anti-infectives (From admission, onward)   Start     Dose/Rate Route Frequency Ordered Stop   12/24/18 0745  ceFAZolin (ANCEF) IVPB 2g/100 mL premix     2 g 200 mL/hr over 30 Minutes Intravenous On call to O.R. 12/24/18 0730 12/24/18 0749   12/24/18 0630  ceFAZolin (ANCEF) IVPB 2g/100 mL premix  Status:  Discontinued    Note to Pharmacy: Amoxicillin allergy is nausea and vomiting.  So Alice Burnside states to go ahead with ancef usage.   2 g 200 mL/hr over 30 Minutes Intravenous  Once 12/24/18 0620 12/24/18 0734    .  She was given sequential compression devices, early ambulation, and ASA for DVT prophylaxis.  She benefited maximally from the hospital stay and there were no complications.    Recent vital signs:  Vitals:   12/25/18 0121 12/25/18 0545  BP: (!) 122/51 (!) 122/48  Pulse: 80  79  Resp: 16 16  Temp: 98 F (36.7 C) 98.4 F (36.9 C)  SpO2: 98% 100%    Recent laboratory studies:  Lab Results  Component Value Date   HGB 10.9 (L) 12/25/2018   HGB 14.6 12/18/2018   HGB 12.0 05/15/2018   Lab Results  Component Value Date   WBC 11.0 (H) 12/25/2018   PLT 239 12/25/2018   Lab Results  Component Value Date   INR 0.98 02/19/2011   Lab Results  Component Value Date   NA 138 12/25/2018   K 4.0 12/25/2018   CL 104 12/25/2018   CO2 25 12/25/2018   BUN 18 12/25/2018   CREATININE 0.63 12/25/2018   GLUCOSE 139 (H) 12/25/2018    Discharge Medications:   Allergies as of 12/25/2018      Reactions   Amoxicillin-pot Clavulanate Nausea And Vomiting   Did it involve swelling of the face/tongue/throat, SOB, or low BP? No Did it involve sudden or severe rash/hives, skin peeling, or any reaction on the inside of your mouth or nose? No Did you need to seek medical attention at a hospital or doctor's office? No When did it last happen?Over 10 years ago If all above answers are "NO", may proceed with cephalosporin use.   Bactrim Nausea And Vomiting   Codeine Rash   Morphine And Related Rash   Oxycodone-acetaminophen Other (See Comments)   Hot flashes and  hallucinations      Medication List    STOP taking these medications   docusate sodium 100 MG capsule Commonly known as: Colace   estradiol 0.1 MG/24HR patch Commonly known as: VIVELLE-DOT   HYDROcodone-acetaminophen 5-325 MG tablet Commonly known as: NORCO/VICODIN   methocarbamol 500 MG tablet Commonly known as: Robaxin     TAKE these medications   aspirin 81 MG chewable tablet Chew 1 tablet (81 mg total) by mouth 2 (two) times daily.   atorvastatin 20 MG tablet Commonly known as: LIPITOR Take 20 mg by mouth every evening.   Elmiron 100 MG capsule Generic drug: pentosan polysulfate 200 mg every 6 (six) weeks. Insert 200 mg into bladder every 6 weeks   gabapentin 300 MG capsule Commonly  known as: NEURONTIN Take 600 mg by mouth 2 (two) times daily.   HYDROmorphone 2 MG tablet Commonly known as: DILAUDID Take 1 tablet (2 mg total) by mouth every 4 (four) hours as needed for severe pain.   hydrOXYzine 25 MG tablet Commonly known as: ATARAX/VISTARIL Take 25 mg by mouth every 8 (eight) hours as needed (bladder pain).   levothyroxine 75 MCG tablet Commonly known as: SYNTHROID Take 75 mcg by mouth daily before breakfast.   loratadine 10 MG tablet Commonly known as: CLARITIN Take 10 mg by mouth daily as needed for allergies.   ondansetron 4 MG tablet Commonly known as: ZOFRAN Take 1 tablet (4 mg total) by mouth every 6 (six) hours as needed for nausea.   polyethylene glycol 17 g packet Commonly known as: MIRALAX / GLYCOLAX Take 17 g by mouth daily as needed for mild constipation. What changed:   when to take this  reasons to take this   Restasis 0.05 % ophthalmic emulsion Generic drug: cycloSPORINE Place 1 drop into both eyes 2 (two) times daily.   senna 8.6 MG Tabs tablet Commonly known as: SENOKOT Take 1 tablet (8.6 mg total) by mouth 2 (two) times daily.   triamterene-hydrochlorothiazide 37.5-25 MG tablet Commonly known as: MAXZIDE-25 Take 1 tablet by mouth every morning.   venlafaxine XR 150 MG 24 hr capsule Commonly known as: EFFEXOR-XR Take 150 mg by mouth every evening.       Diagnostic Studies: Dg Pelvis Portable  Result Date: 12/24/2018 CLINICAL DATA:  LEFT hip replacement EXAM: PORTABLE PELVIS 1-2 VIEWS COMPARISON:  None. FINDINGS: LEFT total hip arthroplasty identified without acute fracture, dislocation or complicating features. IMPRESSION: LEFT total hip arthroplasty without definite complicating features. Electronically Signed   By: Harmon PierJeffrey  Hu M.D.   On: 12/24/2018 12:43   Dg C-arm 1-60 Min-no Report  Result Date: 12/24/2018 Fluoroscopy was utilized by the requesting physician.  No radiographic interpretation.   Dg Hip Operative  Unilat W Or W/o Pelvis Left  Result Date: 12/24/2018 CLINICAL DATA:  Left hip replacement. EXAM: OPERATIVE LEFT HIP (WITH PELVIS IF PERFORMED) 1 VIEW TECHNIQUE: Fluoroscopic spot image(s) were submitted for interpretation post-operatively. COMPARISON:  None. FINDINGS: Intraoperative images demonstrate a left total hip arthroplasty. The entire femoral stem is visualized. No evidence for periprosthetic fracture. Left hip appears located on this single view. IMPRESSION: Intraoperative imaging for left hip arthroplasty. Electronically Signed   By: Richarda OverlieAdam  Henn M.D.   On: 12/24/2018 11:43    Disposition: Discharge disposition: 01-Home or Self Care       Discharge Instructions    Call MD / Call 911   Complete by: As directed    If you experience chest pain or shortness of breath, CALL 911  and be transported to the hospital emergency room.  If you develope a fever above 101 F, pus (white drainage) or increased drainage or redness at the wound, or calf pain, call your surgeon's office.   Constipation Prevention   Complete by: As directed    Drink plenty of fluids.  Prune juice may be helpful.  You may use a stool softener, such as Colace (over the counter) 100 mg twice a day.  Use MiraLax (over the counter) for constipation as needed.   Diet - low sodium heart healthy   Complete by: As directed    Driving restrictions   Complete by: As directed    No driving for 4 weeks   Increase activity slowly as tolerated   Complete by: As directed    Lifting restrictions   Complete by: As directed    No lifting for 6 weeks   TED hose   Complete by: As directed    Use stockings (TED hose) for 2 weeks on both leg(s).  You may remove them at night for sleeping.      Follow-up Information    Florentina Marquart, Arlys JohnBrian, MD. Schedule an appointment as soon as possible for a visit in 2 weeks.   Specialty: Orthopedic Surgery Why: For wound re-check Contact information: [address] [place]  [id number] [phone]            Signed: Iline OvenBrian J Shaivi Rothschild 12/25/2018, 9:29 AM

## 2018-12-25 NOTE — Progress Notes (Signed)
    Subjective:  Patient reports pain as mild to moderate.  Denies N/V/CP/SOB. No c/o. Able to void.  Objective:   VITALS:   Vitals:   12/24/18 1548 12/24/18 2109 12/25/18 0121 12/25/18 0545  BP: 133/62 (!) 111/55 (!) 122/51 (!) 122/48  Pulse: 83 73 80 79  Resp: 16 16 16 16   Temp: 99.2 F (37.3 C) 98 F (36.7 C) 98 F (36.7 C) 98.4 F (36.9 C)  TempSrc:  Oral Oral Oral  SpO2: 98% 92% 98% 100%  Weight:      Height:        NAD ABD soft Sensation intact distally Intact pulses distally Dorsiflexion/Plantar flexion intact Incision: dressing C/D/I Compartment soft   Lab Results  Component Value Date   WBC 11.0 (H) 12/25/2018   HGB 10.9 (L) 12/25/2018   HCT 34.3 (L) 12/25/2018   MCV 95.3 12/25/2018   PLT 239 12/25/2018   BMET    Component Value Date/Time   NA 138 12/25/2018 0312   K 4.0 12/25/2018 0312   CL 104 12/25/2018 0312   CO2 25 12/25/2018 0312   GLUCOSE 139 (H) 12/25/2018 0312   BUN 18 12/25/2018 0312   CREATININE 0.63 12/25/2018 0312   CALCIUM 8.0 (L) 12/25/2018 0312   GFRNONAA >60 12/25/2018 0312   GFRAA >60 12/25/2018 0312     Assessment/Plan: 1 Day Post-Op   Principal Problem:   Osteoarthritis of left hip   WBAT with walker DVT ppx: Aspirin, SCDs, TEDS PO pain control PT/OT Dispo: D/C home with HEP   Hilton Cork Mikle Sternberg 12/25/2018, 9:24 AM   Rod Can, MD Cell: [phone] [facility]  [place] [address], [place], [place], [place] [address] Phone: [phone] [email or web address] Facebook  [employer]

## 2018-12-25 NOTE — Progress Notes (Signed)
Physical Therapy Treatment Patient Details Name: [name] MRN: [id number] DOB: 1955/07/30 Today's Date: 12/25/2018    History of Present Illness s/p L DA THA; Hx back surgery, HTN    PT Comments    POD # 1 am session Demonstrated and instructed pt how to use belt to self assist LE as a leg lifter General transfer comment: 25% cues for safety and hand placement.  Assisted with amb an increased distance.  Practiced stairs.  Then returned to room to perform some TE's following HEP handout.  Instructed on proper tech, freq as well as use of ICE.   Addressed all mobility questions, discussed appropriate activity, educated on use of ICE.  Pt ready for D/C to home.   Follow Up Recommendations  Follow surgeon's recommendation for DC plan and follow-up therapies     Equipment Recommendations  Rolling walker with 5" wheels    Recommendations for Other Services       Precautions / Restrictions Precautions Precautions: Fall Restrictions Weight Bearing Restrictions: No Other Position/Activity Restrictions: WBAT    Mobility  Bed Mobility               General bed mobility comments: OOB in recliner  Transfers Overall transfer level: Needs assistance Equipment used: Rolling walker (2 wheeled) Transfers: Stand Pivot Transfers;Sit to/from Stand Sit to Stand: Supervision;Min guard Stand pivot transfers: Supervision;Min guard       General transfer comment: 25% cues for safety and hand placement  Ambulation/Gait Ambulation/Gait assistance: Supervision;Min guard Gait Distance (Feet): 33 Feet Assistive device: Rolling walker (2 wheeled) Gait Pattern/deviations: Step-to pattern Gait velocity: decreased   General Gait Details: cues for sequence, RW position and safety   Stairs Stairs: Yes Stairs assistance: Min guard Stair Management: Two rails Number of Stairs: 2 General stair comments: 25% VC's on proper sequencing and safety   Wheelchair Mobility    Modified  Rankin (Stroke Patients Only)       Balance                                            Cognition Arousal/Alertness: Awake/alert Behavior During Therapy: WFL for tasks assessed/performed;Anxious Overall Cognitive Status: Within Functional Limits for tasks assessed                                        Exercises   Total Hip Replacement TE's 10 reps ankle pumps 10 reps knee presses 10 reps heel slides 10 reps SAQ's 10 reps ABD Followed by ICE     General Comments        Pertinent Vitals/Pain Pain Assessment: 0-10 Pain Score: 7  Pain Location: L hip Pain Descriptors / Indicators: Grimacing;Sore Pain Intervention(s): Monitored during session;Premedicated before session;Ice applied;Repositioned    Home Living                      Prior Function            PT Goals (current goals can now be found in the care plan section) Progress towards PT goals: Progressing toward goals    Frequency    7X/week      PT Plan Current plan remains appropriate    Co-evaluation              AM-PAC  PT "6 Clicks" Mobility   Outcome Measure  Help needed turning from your back to your side while in a flat bed without using bedrails?: A Little Help needed moving from lying on your back to sitting on the side of a flat bed without using bedrails?: A Little Help needed moving to and from a bed to a chair (including a wheelchair)?: A Little Help needed standing up from a chair using your arms (e.g., wheelchair or bedside chair)?: A Little Help needed to walk in hospital room?: A Little Help needed climbing 3-5 steps with a railing? : A Little 6 Click Score: 18    End of Session Equipment Utilized During Treatment: Gait belt Activity Tolerance: Patient tolerated treatment well;Patient limited by pain Patient left: in chair;with call bell/phone within reach;with chair alarm set Nurse Communication: Mobility status PT Visit  Diagnosis: Difficulty in walking, not elsewhere classified (R26.2)     Time: 5329-9242 PT Time Calculation (min) (ACUTE ONLY): 25 min  Charges:  $Gait Training: 8-22 mins $Therapeutic Exercise: 8-22 mins                     Rica Koyanagi  PTA Acute  Rehabilitation Services Pager      [phone] Office      [phone]

## 2019-01-11 DIAGNOSIS — Z471 Aftercare following joint replacement surgery: Secondary | ICD-10-CM | POA: Diagnosis not present

## 2019-01-11 DIAGNOSIS — Z96642 Presence of left artificial hip joint: Secondary | ICD-10-CM | POA: Diagnosis not present

## 2019-02-08 DIAGNOSIS — Z471 Aftercare following joint replacement surgery: Secondary | ICD-10-CM | POA: Diagnosis not present

## 2019-02-08 DIAGNOSIS — M7061 Trochanteric bursitis, right hip: Secondary | ICD-10-CM | POA: Diagnosis not present

## 2019-02-08 DIAGNOSIS — Z96642 Presence of left artificial hip joint: Secondary | ICD-10-CM | POA: Diagnosis not present

## 2019-02-10 DIAGNOSIS — N3010 Interstitial cystitis (chronic) without hematuria: Secondary | ICD-10-CM | POA: Diagnosis not present

## 2019-02-15 DIAGNOSIS — D225 Melanocytic nevi of trunk: Secondary | ICD-10-CM | POA: Diagnosis not present

## 2019-02-15 DIAGNOSIS — L718 Other rosacea: Secondary | ICD-10-CM | POA: Diagnosis not present

## 2019-02-15 DIAGNOSIS — L918 Other hypertrophic disorders of the skin: Secondary | ICD-10-CM | POA: Diagnosis not present

## 2019-02-15 DIAGNOSIS — L308 Other specified dermatitis: Secondary | ICD-10-CM | POA: Diagnosis not present

## 2019-02-18 DIAGNOSIS — L648 Other androgenic alopecia: Secondary | ICD-10-CM | POA: Diagnosis not present

## 2019-02-22 DIAGNOSIS — H43811 Vitreous degeneration, right eye: Secondary | ICD-10-CM | POA: Diagnosis not present

## 2019-03-08 DIAGNOSIS — H5213 Myopia, bilateral: Secondary | ICD-10-CM | POA: Diagnosis not present

## 2019-03-08 DIAGNOSIS — H43811 Vitreous degeneration, right eye: Secondary | ICD-10-CM | POA: Diagnosis not present

## 2019-03-08 DIAGNOSIS — H16223 Keratoconjunctivitis sicca, not specified as Sjogren's, bilateral: Secondary | ICD-10-CM | POA: Diagnosis not present

## 2019-03-08 DIAGNOSIS — H52203 Unspecified astigmatism, bilateral: Secondary | ICD-10-CM | POA: Diagnosis not present

## 2019-04-06 ENCOUNTER — Encounter: Payer: Self-pay | Admitting: Gynecology

## 2019-04-09 DIAGNOSIS — N3010 Interstitial cystitis (chronic) without hematuria: Secondary | ICD-10-CM | POA: Diagnosis not present

## 2019-05-06 ENCOUNTER — Other Ambulatory Visit: Payer: Self-pay

## 2019-05-11 ENCOUNTER — Other Ambulatory Visit: Payer: Self-pay

## 2019-05-28 DIAGNOSIS — N3010 Interstitial cystitis (chronic) without hematuria: Secondary | ICD-10-CM | POA: Diagnosis not present

## 2019-06-25 ENCOUNTER — Other Ambulatory Visit: Payer: Self-pay | Admitting: *Deleted

## 2019-06-25 MED ORDER — ESTRADIOL 0.1 MG/24HR TD PTTW: 1.0000 | MEDICATED_PATCH | TRANSDERMAL | 0 refills | Status: DC

## 2019-06-30 ENCOUNTER — Ambulatory Visit
Admission: RE | Admit: 2019-06-30 | Discharge: 2019-06-30 | Disposition: A | Discharge status: Home / Self Care | Payer: BC Managed Care – PPO | Source: Ambulatory Visit | Attending: Women's Health | Admitting: Women's Health

## 2019-06-30 ENCOUNTER — Ambulatory Visit: Payer: BC Managed Care – PPO

## 2019-06-30 ENCOUNTER — Other Ambulatory Visit: Payer: Self-pay

## 2019-06-30 DIAGNOSIS — Z1231 Encounter for screening mammogram for malignant neoplasm of breast: Secondary | ICD-10-CM | POA: Diagnosis not present

## 2019-07-05 ENCOUNTER — Encounter: Payer: Self-pay | Admitting: Women's Health

## 2019-07-05 ENCOUNTER — Ambulatory Visit: Payer: BC Managed Care – PPO | Admitting: Women's Health

## 2019-07-05 ENCOUNTER — Other Ambulatory Visit: Payer: Self-pay

## 2019-07-05 VITALS — BP 118/80 | Ht 61.0 in | Wt 203.0 lb

## 2019-07-05 DIAGNOSIS — Z01419 Encounter for gynecological examination (general) (routine) without abnormal findings: Secondary | ICD-10-CM

## 2019-07-05 MED ORDER — ESTRADIOL 0.05 MG/24HR TD PTTW: 1.0000 | MEDICATED_PATCH | TRANSDERMAL | 4 refills | Status: DC

## 2019-07-05 MED ORDER — FLUCONAZOLE 100 MG PO TABS: ORAL_TABLET | ORAL | 0 refills | Status: DC

## 2019-07-05 NOTE — Progress Notes (Signed)
[name] 08/22/1955 [id number]    History:    Presents for annual exam.  1985 TAH with LSO on HRT.  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   normal Paps after.  Normal mammogram history.  12/2018 left total hip from osteoarthritis, also back surgery last year.  Primary care manages hypertension, hypothyroidism, arthritis, anxiety and depression.  2019 normal DEXA.  Past medical history, past surgical history, family history and social history were all reviewed and documented in the EPIC chart.  Retired.  Helps care for her 64 and 64-year-old granddaughters.  ROS:  A ROS was performed and pertinent positives and negatives are included.  Exam:  Vitals:   07/05/63 1400  BP: 118/80  Weight: 203 lb (92.1 kg)  Height: 5\' 1"  (1.549 m)   Body mass index is 38.36 kg/m.   General appearance:  Normal Thyroid:  Symmetrical, normal in size, without palpable masses or nodularity. Respiratory  Auscultation:  Clear without wheezing or rhonchi Cardiovascular  Auscultation:  Regular rate, without rubs, murmurs or gallops  Edema/varicosities:  Not grossly evident Abdominal  Soft,nontender, without masses, guarding or rebound.  Liver/spleen:  No organomegaly noted  Hernia:  None appreciated  Skin  Inspection:  Grossly normal   Breasts: Examined lying and sitting.     Right: Without masses, retractions, discharge or axillary adenopathy.     Left: Without masses, retractions, discharge or axillary adenopathy. Gentitourinary   Inguinal/mons:  Normal without inguinal adenopathy  External genitalia:  Normal  BUS/Urethra/Skene's glands:  Normal  Vagina: Mild atrophy   Cervix: And uterus absent   Adnexa/parametria:     Rt: Without masses or tenderness.   Lt: Without masses or tenderness.  Anus and perineum: Normal  Digital rectal exam: Normal sphincter tone without palpated masses or tenderness  Assessment/Plan:  63 y.o. S WF G1, P1 for annual exam, reports fatigue helping with home schooling her  34-year-old granddaughter.    1985 TAH with LSO on  Vivelle patch Hypertension, hypothyroidism, IC, arthritis, anxiety/depression-primary care manages labs and meds Obesity  Plan: Shingrex discussed and encouraged.  Has not had a screening colonoscopy reviewed importance, [facility] information given instructed to schedule.  SBEs, continue annual screening mammogram, calcium rich foods, vitamin D 2000 daily encouraged.  HRT reviewed risks of blood clots, strokes currently on 0.1 has had problems coming down on dose in the past with numerous hot flashes we will try Vivelle dot 0.5 twice weekly prescription, proper use given reviewed importance of stopping.  Reviewed importance of increasing regular exercise, decreasing calories/carbs and importance of self-care, leisure activities.  Has had 2 major surgeries this past year and has had more fatigue since.  Diflucan 100 mg number 15 tablets to use as needed with dental procedures with antibiotics and bladder treatments.  Aware to use sparingly.     Huel Cote [facility], 2:13 PM 07/05/2019

## 2019-07-05 NOTE — Patient Instructions (Addendum)
It was good to see you today Vitamin D 2000 IUs daily Shingrex to series shingles vaccine for shingles prevention Colonoscopy [facility]  [phone]  Dr Leone Payor  Health Maintenance for Postmenopausal Women Menopause is a normal process in which your ability to get pregnant comes to an end. This process happens slowly over many months or years, usually between the ages of 6 and 47. Menopause is complete when you have missed your menstrual periods for 12 months. It is important to talk with your health care provider about some of the most common conditions that affect women after menopause (postmenopausal women). These include heart disease, cancer, and bone loss (osteoporosis). Adopting a healthy lifestyle and getting preventive care can help to promote your health and wellness. The actions you take can also lower your chances of developing some of these common conditions. What should I know about menopause? During menopause, you may get a number of symptoms, such as:  Hot flashes. These can be moderate or severe.  Night sweats.  Decrease in sex drive.  Mood swings.  Headaches.  Tiredness.  Irritability.  Memory problems.  Insomnia. Choosing to treat or not to treat these symptoms is a decision that you make with your health care provider. Do I need hormone replacement therapy?  Hormone replacement therapy is effective in treating symptoms that are caused by menopause, such as hot flashes and night sweats.  Hormone replacement carries certain risks, especially as you become older. If you are thinking about using estrogen or estrogen with progestin, discuss the benefits and risks with your health care provider. What is my risk for heart disease and stroke? The risk of heart disease, heart attack, and stroke increases as you age. One of the causes may be a change in the body's hormones during menopause. This can affect how your body uses dietary fats, triglycerides, and cholesterol.  Heart attack and stroke are medical emergencies. There are many things that you can do to help prevent heart disease and stroke. Watch your blood pressure  High blood pressure causes heart disease and increases the risk of stroke. This is more likely to develop in people who have high blood pressure readings, are of African descent, or are overweight.  Have your blood pressure checked: ? Every 3-5 years if you are 9-64 years of age. ? Every year if you are 29 years old or older. Eat a healthy diet   Eat a diet that includes plenty of vegetables, fruits, low-fat dairy products, and lean protein.  Do not eat a lot of foods that are high in solid fats, added sugars, or sodium. Get regular exercise Get regular exercise. This is one of the most important things you can do for your health. Most adults should:  Try to exercise for at least 150 minutes each week. The exercise should increase your heart rate and make you sweat (moderate-intensity exercise).  Try to do strengthening exercises at least twice each week. Do these in addition to the moderate-intensity exercise.  Spend less time sitting. Even light physical activity can be beneficial. Other tips  Work with your health care provider to achieve or maintain a healthy weight.  Do not use any products that contain nicotine or tobacco, such as cigarettes, e-cigarettes, and chewing tobacco. If you need help quitting, ask your health care provider.  Know your numbers. Ask your health care provider to check your cholesterol and your blood sugar (glucose). Continue to have your blood tested as directed by your health care  provider. Do I need screening for cancer? Depending on your health history and family history, you may need to have cancer screening at different stages of your life. This may include screening for:  Breast cancer.  Cervical cancer.  Lung cancer.  Colorectal cancer. What is my risk for osteoporosis? After menopause,  you may be at increased risk for osteoporosis. Osteoporosis is a condition in which bone destruction happens more quickly than new bone creation. To help prevent osteoporosis or the bone fractures that can happen because of osteoporosis, you may take the following actions:  If you are 68-26 years old, get at least 1,000 mg of calcium and at least 600 mg of vitamin D per day.  If you are older than age 42 but younger than age 26, get at least 1,200 mg of calcium and at least 600 mg of vitamin D per day.  If you are older than age 75, get at least 1,200 mg of calcium and at least 800 mg of vitamin D per day. Smoking and drinking excessive alcohol increase the risk of osteoporosis. Eat foods that are rich in calcium and vitamin D, and do weight-bearing exercises several times each week as directed by your health care provider. How does menopause affect my mental health? Depression may occur at any age, but it is more common as you become older. Common symptoms of depression include:  Low or sad mood.  Changes in sleep patterns.  Changes in appetite or eating patterns.  Feeling an overall lack of motivation or enjoyment of activities that you previously enjoyed.  Frequent crying spells. Talk with your health care provider if you think that you are experiencing depression. General instructions See your health care provider for regular wellness exams and vaccines. This may include:  Scheduling regular health, dental, and eye exams.  Getting and maintaining your vaccines. These include: ? Influenza vaccine. Get this vaccine each year before the flu season begins. ? Pneumonia vaccine. ? Shingles vaccine. ? Tetanus, diphtheria, and pertussis (Tdap) booster vaccine. Your health care provider may also recommend other immunizations. Tell your health care provider if you have ever been abused or do not feel safe at home. Summary  Menopause is a normal process in which your ability to get  pregnant comes to an end.  This condition causes hot flashes, night sweats, decreased interest in sex, mood swings, headaches, or lack of sleep.  Treatment for this condition may include hormone replacement therapy.  Take actions to keep yourself healthy, including exercising regularly, eating a healthy diet, watching your weight, and checking your blood pressure and blood sugar levels.  Get screened for cancer and depression. Make sure that you are up to date with all your vaccines. This information is not intended to replace advice given to you by your health care provider. Make sure you discuss any questions you have with your health care provider. Document Released: 08/16/2005 Document Revised: 06/17/2018 Document Reviewed: 06/17/2018 Elsevier Patient Education  2020 [employer].

## 2019-07-12 DIAGNOSIS — R350 Frequency of micturition: Secondary | ICD-10-CM | POA: Diagnosis not present

## 2019-07-12 DIAGNOSIS — N3010 Interstitial cystitis (chronic) without hematuria: Secondary | ICD-10-CM | POA: Diagnosis not present

## 2019-07-12 DIAGNOSIS — T83721S Exposure of implanted vaginal mesh and other prosthetic materials into vagina, sequela: Secondary | ICD-10-CM | POA: Diagnosis not present

## 2019-08-05 DIAGNOSIS — E039 Hypothyroidism, unspecified: Secondary | ICD-10-CM | POA: Diagnosis not present

## 2019-08-05 DIAGNOSIS — F411 Generalized anxiety disorder: Secondary | ICD-10-CM | POA: Diagnosis not present

## 2019-08-05 DIAGNOSIS — I10 Essential (primary) hypertension: Secondary | ICD-10-CM | POA: Diagnosis not present

## 2019-08-27 DIAGNOSIS — N3010 Interstitial cystitis (chronic) without hematuria: Secondary | ICD-10-CM | POA: Diagnosis not present

## 2019-09-21 DIAGNOSIS — L237 Allergic contact dermatitis due to plants, except food: Secondary | ICD-10-CM | POA: Diagnosis not present

## 2019-09-21 DIAGNOSIS — L282 Other prurigo: Secondary | ICD-10-CM | POA: Diagnosis not present

## 2019-10-15 DIAGNOSIS — N3010 Interstitial cystitis (chronic) without hematuria: Secondary | ICD-10-CM | POA: Diagnosis not present

## 2019-11-26 DIAGNOSIS — N3010 Interstitial cystitis (chronic) without hematuria: Secondary | ICD-10-CM | POA: Diagnosis not present

## 2020-08-28 ENCOUNTER — Other Ambulatory Visit: Payer: Self-pay

## 2020-08-28 NOTE — Telephone Encounter (Signed)
Medication refill request: Vivelle Dot  Last AEX:  07/05/19  Next AEX: none scheduled. Note sent to pharmacy advising annual exam is needed for additional refills.  Last MMG (if hormonal medication request): 06/30/19 density a Bi-rads 1 neg  Refill authorized: #8 patches with 0 Rf pended for today

## 2021-11-13 ENCOUNTER — Ambulatory Visit: Payer: Self-pay | Admitting: Student

## 2021-11-26 NOTE — Progress Notes (Signed)
Anesthesia Review:  PCP: Cardiologist : Chest x-ray : EKG : Echo : Stress test: Cardiac Cath :  Activity level:  Sleep Study/ CPAP : Fasting Blood Sugar :      / Checks Blood Sugar -- times a day:   Blood Thinner/ Instructions /Last Dose: ASA / Instructions/ Last Dose :

## 2021-11-26 NOTE — Progress Notes (Signed)
DUE TO COVID-19 ONLY  2  VISITOR IS ALLOWED TO COME WITH YOU AND STAY IN THE WAITING ROOM ONLY DURING PRE OP AND PROCEDURE DAY OF SURGERY.   4 VISITOR  MAY VISIT WITH YOU AFTER SURGERY IN YOUR PRIVATE ROOM DURING VISITING HOURS ONLY! YOU MAY HAVE ONE PERSON SPEND THE NITE WITH YOU IN YOUR ROOM AFTER SURGERY.     Your procedure is scheduled on:    12/06/2021   Report to [facility] Main  Entrance   Report to admitting at    0515            AM DO NOT BRING INSURANCE CARD, PICTURE ID OR WALLET DAY OF SURGERY.      Call this number if you have problems the morning of surgery [phone]    REMEMBER: NO  SOLID FOODS , CANDY, GUM OR MINTS AFTER MIDNITE THE NITE BEFORE SURGERY .       Marland Kitchen CLEAR LIQUIDS UNTIL    0430am             DAY OF SURGERY.      PLEASE FINISH ENSURE DRINK PER SURGEON ORDER  WHICH NEEDS TO BE COMPLETED AT    0430am      MORNING OF SURGERY.       CLEAR LIQUID DIET   Foods Allowed      WATER BLACK COFFEE ( SUGAR OK, NO MILK, CREAM OR CREAMER) REGULAR AND DECAF  TEA ( SUGAR OK NO MILK, CREAM, OR CREAMER) REGULAR AND DECAF  PLAIN JELLO ( NO RED)  FRUIT ICES ( NO RED, NO FRUIT PULP)  POPSICLES ( NO RED)  JUICE- APPLE, WHITE GRAPE AND WHITE CRANBERRY  SPORT DRINK LIKE GATORADE ( NO RED)  CLEAR BROTH ( VEGETABLE , CHICKEN OR BEEF)                                                                     BRUSH YOUR TEETH MORNING OF SURGERY AND RINSE YOUR MOUTH OUT, NO CHEWING GUM CANDY OR MINTS.     Take these medicines the morning of surgery with A SIP OF WATER:  gabapentin, synthroid, vesicare if needed    DO NOT TAKE ANY DIABETIC MEDICATIONS DAY OF YOUR SURGERY                               You may not have any metal on your body including hair pins and              piercings  Do not wear jewelry, make-up, lotions, powders or perfumes, deodorant             Do not wear nail polish on your fingernails.              IF YOU ARE A FEMALE AND WANT TO SHAVE UNDER ARMS  OR LEGS PRIOR TO SURGERY YOU MUST DO SO AT LEAST 48 HOURS PRIOR TO SURGERY.              Men may shave face and neck.   Do not bring valuables to the hospital. [facility] IS NOT  RESPONSIBLE   FOR VALUABLES.  Contacts, dentures or bridgework may not be worn into surgery.  Leave suitcase in the car. After surgery it may be brought to your room.     Patients discharged the day of surgery will not be allowed to drive home. IF YOU ARE HAVING SURGERY AND GOING HOME THE SAME DAY, YOU MUST HAVE AN ADULT TO DRIVE YOU HOME AND BE WITH YOU FOR 24 HOURS. YOU MAY GO HOME BY TAXI OR UBER OR ORTHERWISE, BUT AN ADULT MUST ACCOMPANY YOU HOME AND STAY WITH YOU FOR 24 HOURS.                Please read over the following fact sheets you were given: _____________________________________________________________________  [facility] - Preparing for Surgery Before surgery, you can play an important role.  Because skin is not sterile, your skin needs to be as free of germs as possible.  You can reduce the number of germs on your skin by washing with CHG (chlorahexidine gluconate) soap before surgery.  CHG is an antiseptic cleaner which kills germs and bonds with the skin to continue killing germs even after washing. Please DO NOT use if you have an allergy to CHG or antibacterial soaps.  If your skin becomes reddened/irritated stop using the CHG and inform your nurse when you arrive at Short Stay. Do not shave (including legs and underarms) for at least 48 hours prior to the first CHG shower.  You may shave your face/neck. Please follow these instructions carefully:  1.  Shower with CHG Soap the night before surgery and the  morning of Surgery.  2.  If you choose to wash your hair, wash your hair first as usual with your  normal  shampoo.  3.  After you shampoo, rinse your hair and body thoroughly to remove the  shampoo.                           4.  Use CHG as you would any other liquid soap.  You can  apply chg directly  to the skin and wash                       Gently with a scrungie or clean washcloth.  5.  Apply the CHG Soap to your body ONLY FROM THE NECK DOWN.   Do not use on face/ open                           Wound or open sores. Avoid contact with eyes, ears mouth and genitals (private parts).                       Wash face,  Genitals (private parts) with your normal soap.             6.  Wash thoroughly, paying special attention to the area where your surgery  will be performed.  7.  Thoroughly rinse your body with warm water from the neck down.  8.  DO NOT shower/wash with your normal soap after using and rinsing off  the CHG Soap.                9.  Pat yourself dry with a clean towel.            10.  Wear clean pajamas.  11.  Place clean sheets on your bed the night of your first shower and do not  sleep with pets. Day of Surgery : Do not apply any lotions/deodorants the morning of surgery.  Please wear clean clothes to the hospital/surgery center.  FAILURE TO FOLLOW THESE INSTRUCTIONS MAY RESULT IN THE CANCELLATION OF YOUR SURGERY PATIENT SIGNATURE_________________________________  NURSE SIGNATURE__________________________________  ________________________________________________________________________

## 2021-11-27 ENCOUNTER — Ambulatory Visit: Payer: Self-pay | Admitting: Student

## 2021-11-27 NOTE — H&P (View-Only) (Signed)
TOTAL HIP ADMISSION H&P  Patient is admitted for right total hip arthroplasty.  Subjective:  Chief Complaint: right hip pain  HPI: [name], 66 y.o. female, has a history of pain and functional disability in the right hip(s) due to arthritis and patient has failed non-surgical conservative treatments for greater than 12 weeks to include NSAID's and/or analgesics, corticosteriod injections, use of assistive devices, weight reduction as appropriate, and activity modification.  Onset of symptoms was gradual starting 9 years ago with rapidlly worsening course since that time.The patient noted no past surgery on the right hip(s).  Patient currently rates pain in the right hip at 9 out of 10 with activity. Patient has night pain, worsening of pain with activity and weight bearing, trendelenberg gait, pain that interfers with activities of daily living, and pain with passive range of motion. Patient has evidence of subchondral cysts, subchondral sclerosis, periarticular osteophytes, and joint space narrowing by imaging studies. This condition presents safety issues increasing the risk of falls. There is no current active infection.  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   ASCUS (atypical squamous cells of undetermined significance) on Pap smear 07/2005   NEG FOR HIGH RISK HPV   Complication of anesthesia    Depression    Endometriosis    Fibromyalgia    left shoulder   Hypertension    Hypothyroid    IC (interstitial cystitis)    Menopausal state    PONV (postoperative nausea and  vomiting)     Past Surgical History:  Procedure Laterality Date   ABDOMINAL HYSTERECTOMY     TAH,LSO, patient reports she only had partial hysterectomy, still has right ovary and fallopian  tube    BLADDER SUSPENSION  2005   X2   CARPAL TUNNEL RELEASE  2011, 2012   CESAREAN SECTION     LUMBAR LAMINECTOMY/DECOMPRESSION MICRODISCECTOMY N/A 05/14/2018   Procedure: Microlumbar decompression Lumbar Three-Four, Lumbar Four-Five with Removal Synovial Cyst;  Surgeon: Susa Day, MD;  Location: [place];  Service: Orthopedics;  Laterality: N/A;  3 hrs   OOPHORECTOMY     LSO WITH TAH   PELVIC LAPAROSCOPY     right broken ankle repair  2014   TOE SURGERY  05/2009   PIN IN OUTER TOE RIGHT FOOT   TOTAL HIP ARTHROPLASTY Left 12/24/2018   Procedure: TOTAL HIP ARTHROPLASTY ANTERIOR APPROACH;  Surgeon: Rod Can, MD;  Location: [facility];  Service: Orthopedics;  Laterality: Left;    Current Outpatient Medications  Medication Sig Dispense Refill Last Dose   acetaminophen (TYLENOL) 500 MG tablet Take 500-1,000 mg by mouth every 6 (six) hours as needed (for pain.).      Artificial Tear Solution (GENTEAL TEARS) 0.1-0.2-0.3 % SOLN Place 1 drop into both eyes at bedtime as needed (dry/irritated eyes.).      atorvastatin (LIPITOR) 20 MG tablet Take 20 mg by mouth every evening.      Biotin w/ Vitamins C & E (HAIR SKIN & NAILS GUMMIES PO) Take 2 tablets by  mouth at bedtime.      cycloSPORINE (RESTASIS) 0.05 % ophthalmic emulsion Place 1 drop into both eyes daily with lunch.      diphenhydrAMINE (BENADRYL) 25 mg capsule Take 25 mg by mouth 2 (two) times daily as needed for allergies.      ELMIRON 100 MG capsule 200 mg every 6 (six) weeks. Insert 200 mg into bladder every 6 weeks  3    Emollient (CERAVE EX) Apply 1 application. topically See admin instructions. Mix with oxy      estradiol (VIVELLE-DOT) 0.05 MG/24HR patch Place 1 patch (0.05 mg total) onto the skin 2 (two) times a week. (Patient taking  differently: Place 1 patch onto the skin 2 (two) times a week. Mondays & Thursdays) 24 patch 4    fluconazole (DIFLUCAN) 150 MG tablet Take 150 mg by mouth every other day as needed (if needed with antibiotics and bladder treatments.).      gabapentin (NEURONTIN) 300 MG capsule Take 300 mg by mouth 2 (two) times daily.      hydrOXYzine (ATARAX) 25 MG tablet Take 25 mg by mouth at bedtime.      ibuprofen (ADVIL) 200 MG tablet Take 800 mg by mouth 3 (three) times daily as needed (pain.).      levothyroxine (SYNTHROID, LEVOTHROID) 75 MCG tablet Take 75 mcg by mouth daily before breakfast.   1    loperamide (IMODIUM A-D) 2 MG tablet Take 2-4 mg by mouth 4 (four) times daily as needed for diarrhea or loose stools.      Menthol, Topical Analgesic, (BIOFREEZE COOL THE PAIN EX) Apply 1 application. topically 3 (three) times daily as needed (pain.).      Multiple Vitamins-Minerals (ADULT ONE DAILY GUMMIES PO) Take 2 tablets by mouth every evening.      oxymetazoline (AFRIN) 0.05 % nasal spray Place 1 spray into both nostrils See admin instructions. Mix with cerave and use as needed for outdoor exposure      penicillin v potassium (VEETID) 500 MG tablet Take 2,000 mg by mouth See admin instructions. Take 4 tablets (2000 mg) by mouth 1 hour prior to dental appointments      simethicone (MYLICON) 0000000 MG chewable tablet Chew 125 mg by mouth every 6 (six) hours as needed for flatulence.      solifenacin (VESICARE) 5 MG tablet Take 5 mg by mouth 2 (two) times daily as needed (bladder frequency).      triamterene-hydrochlorothiazide (MAXZIDE-25) 37.5-25 MG tablet Take 1 tablet by mouth every morning.  1    venlafaxine (EFFEXOR-XR) 150 MG 24 hr capsule Take 150 mg by mouth at bedtime.      Vitamins-Lipotropics (LIPOFLAVONOID) TABS Take 2 tablets by mouth at bedtime.      No current facility-administered medications for this visit.   Allergies  Allergen Reactions   Penicillins Other (See Comments)    Sick on  stomach, "hurts my stomach"   Augmentin [Amoxicillin-Pot Clavulanate] Nausea And Vomiting    Did it involve swelling of the face/tongue/throat, SOB, or low BP? No Did it involve sudden or severe rash/hives, skin peeling, or any reaction on the inside of your mouth or nose? No Did you need to seek medical attention at a hospital or doctor's office? No When did it last happen? Over 10 years ago If all above answers are "NO", may proceed with cephalosporin use.   Bactrim Nausea And Vomiting   Codeine Rash   Morphine And Related Rash   Percocet [Oxycodone-Acetaminophen] Other (See  Comments)    Hot flashes and hallucinations    Social History   Tobacco Use   Smoking status: Never   Smokeless tobacco: Never  Substance Use Topics   Alcohol use: No    Family History  Problem Relation Age of Onset   Hypertension Mother    Breast cancer Mother        29'S     Review of Systems  Genitourinary:        Has Interstitial cystitis  Musculoskeletal:  Positive for arthralgias, back pain, gait problem and myalgias.  All other systems reviewed and are negative.  Objective:  Physical Exam Constitutional:      Appearance: Normal appearance. She is obese.  HENT:     Head: Normocephalic.     Nose: Nose normal.     Mouth/Throat:     Mouth: Mucous membranes are moist.     Pharynx: Oropharynx is clear.  Eyes:     Extraocular Movements: Extraocular movements intact.  Cardiovascular:     Rate and Rhythm: Normal rate and regular rhythm.     Pulses: Normal pulses.     Heart sounds: Normal heart sounds.  Pulmonary:     Effort: Pulmonary effort is normal.     Breath sounds: Normal breath sounds.  Abdominal:     General: Abdomen is flat.     Palpations: Abdomen is soft.  Genitourinary:    Comments: deferred Musculoskeletal:     Cervical back: Normal range of motion and neck supple.     Comments: Right hip:  no skin wounds or lesions. She has moderately restricted range of motion of the  right hip. Pain with terminal flexion and rotation. She has trochanteric tenderness to palpation. Abductor strength is 3/5 with pain.  Left hip. Well healed surgical incision from anterior THA. Abductor strength 4/5.  Skin:    General: Skin is warm and dry.     Capillary Refill: Capillary refill takes less than 2 seconds.  Neurological:     General: No focal deficit present.     Mental Status: She is alert and oriented to person, place, and time.  Psychiatric:        Mood and Affect: Mood normal.        Behavior: Behavior normal.        Thought Content: Thought content normal.        Judgment: Judgment normal.    Vital signs in last 24 hours: @VSRANGES @  Labs:   Estimated body mass index is 38.36 kg/m as calculated from the following:   Height as of 07/05/19: 5\' 1"  (1.549 m).   Weight as of 07/05/19: 92.1 kg.   Imaging Review Plain radiographs demonstrate severe degenerative joint disease of the right hip(s). The bone quality appears to be adequate for age and reported activity level.      Assessment/Plan:  End stage arthritis, right hip(s)  The patient history, physical examination, clinical judgement of the provider and imaging studies are consistent with end stage degenerative joint disease of the right hip(s) and total hip arthroplasty is deemed medically necessary. The treatment options including medical management, injection therapy, arthroscopy and arthroplasty were discussed at length. The risks and benefits of total hip arthroplasty were presented and reviewed. The risks due to aseptic loosening, infection, stiffness, dislocation/subluxation,  thromboembolic complications and other imponderables were discussed.  The patient acknowledged the explanation, agreed to proceed with the plan and consent was signed. Patient is being admitted for inpatient treatment for surgery, pain control,  PT, OT, prophylactic antibiotics, VTE prophylaxis, progressive ambulation and ADL's  and discharge planning.The patient is planning to be discharged home with home exercise program.    Therapy Plans: HEP.  Disposition: Home with daughter and partner.  Planned DVT Prophylaxis: aspirin 81mg  BID.  DME needed: walker.  PCP: Cleared. Patient to hold estrogen 2 weeks prior to surgery due to VTE risk.  TXA: Yes Allergies:  - Augmentin- N/V - Bactrim- rash, stomach upset. - Codeine - rash - Morphine - rash - Penicillins- Stomach hurts - Percocet - hallucination, hot flashes Anesthesia Concerns: None??? BMI: 39.7 Last HgbA1c: 5.8 Other: - Left THA 2020.  - Cr: 0.92 - Interstitial cystitis - Fibromyalgia - Low back pain - Hydrocodone, ibuprofen.     Patient's anticipated LOS is less than 2 midnights, meeting these requirements: - Younger than 40 - Lives within 1 hour of care - Has a competent adult at home to recover with post-op recover - NO history of  - Chronic pain requiring opiods  - Diabetes  - Coronary Artery Disease  - Heart failure  - Heart attack  - Stroke  - DVT/VTE  - Cardiac arrhythmia  - Respiratory Failure/COPD  - Renal failure  - Anemia  - Advanced Liver disease

## 2021-11-27 NOTE — H&P (Signed)
TOTAL HIP ADMISSION H&P  Patient is admitted for right total hip arthroplasty.  Subjective:  Chief Complaint: right hip pain  HPI: [name], 66 y.o. female, has a history of pain and functional disability in the right hip(s) due to arthritis and patient has failed non-surgical conservative treatments for greater than 12 weeks to include NSAID's and/or analgesics, corticosteriod injections, use of assistive devices, weight reduction as appropriate, and activity modification.  Onset of symptoms was gradual starting 9 years ago with rapidlly worsening course since that time.The patient noted no past surgery on the right hip(s).  Patient currently rates pain in the right hip at 9 out of 10 with activity. Patient has night pain, worsening of pain with activity and weight bearing, trendelenberg gait, pain that interfers with activities of daily living, and pain with passive range of motion. Patient has evidence of subchondral cysts, subchondral sclerosis, periarticular osteophytes, and joint space narrowing by imaging studies. This condition presents safety issues increasing the risk of falls. There is no current active infection.  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   ASCUS (atypical squamous cells of undetermined significance) on Pap smear 07/2005   NEG FOR HIGH RISK HPV   Complication of anesthesia    Depression    Endometriosis    Fibromyalgia    left shoulder   Hypertension    Hypothyroid    IC (interstitial cystitis)    Menopausal state    PONV (postoperative nausea and  vomiting)     Past Surgical History:  Procedure Laterality Date   ABDOMINAL HYSTERECTOMY     TAH,LSO, patient reports she only had partial hysterectomy, still has right ovary and fallopian  tube    BLADDER SUSPENSION  2005   X2   CARPAL TUNNEL RELEASE  2011, 2012   CESAREAN SECTION     LUMBAR LAMINECTOMY/DECOMPRESSION MICRODISCECTOMY N/A 05/14/2018   Procedure: Microlumbar decompression Lumbar Three-Four, Lumbar Four-Five with Removal Synovial Cyst;  Surgeon: Susa Day, MD;  Location: [place];  Service: Orthopedics;  Laterality: N/A;  3 hrs   OOPHORECTOMY     LSO WITH TAH   PELVIC LAPAROSCOPY     right broken ankle repair  2014   TOE SURGERY  05/2009   PIN IN OUTER TOE RIGHT FOOT   TOTAL HIP ARTHROPLASTY Left 12/24/2018   Procedure: TOTAL HIP ARTHROPLASTY ANTERIOR APPROACH;  Surgeon: Rod Can, MD;  Location: [facility];  Service: Orthopedics;  Laterality: Left;    Current Outpatient Medications  Medication Sig Dispense Refill Last Dose   acetaminophen (TYLENOL) 500 MG tablet Take 500-1,000 mg by mouth every 6 (six) hours as needed (for pain.).      Artificial Tear Solution (GENTEAL TEARS) 0.1-0.2-0.3 % SOLN Place 1 drop into both eyes at bedtime as needed (dry/irritated eyes.).      atorvastatin (LIPITOR) 20 MG tablet Take 20 mg by mouth every evening.      Biotin w/ Vitamins C & E (HAIR SKIN & NAILS GUMMIES PO) Take 2 tablets by  mouth at bedtime.      cycloSPORINE (RESTASIS) 0.05 % ophthalmic emulsion Place 1 drop into both eyes daily with lunch.      diphenhydrAMINE (BENADRYL) 25 mg capsule Take 25 mg by mouth 2 (two) times daily as needed for allergies.      ELMIRON 100 MG capsule 200 mg every 6 (six) weeks. Insert 200 mg into bladder every 6 weeks  3    Emollient (CERAVE EX) Apply 1 application. topically See admin instructions. Mix with oxy      estradiol (VIVELLE-DOT) 0.05 MG/24HR patch Place 1 patch (0.05 mg total) onto the skin 2 (two) times a week. (Patient taking  differently: Place 1 patch onto the skin 2 (two) times a week. Mondays & Thursdays) 24 patch 4    fluconazole (DIFLUCAN) 150 MG tablet Take 150 mg by mouth every other day as needed (if needed with antibiotics and bladder treatments.).      gabapentin (NEURONTIN) 300 MG capsule Take 300 mg by mouth 2 (two) times daily.      hydrOXYzine (ATARAX) 25 MG tablet Take 25 mg by mouth at bedtime.      ibuprofen (ADVIL) 200 MG tablet Take 800 mg by mouth 3 (three) times daily as needed (pain.).      levothyroxine (SYNTHROID, LEVOTHROID) 75 MCG tablet Take 75 mcg by mouth daily before breakfast.   1    loperamide (IMODIUM A-D) 2 MG tablet Take 2-4 mg by mouth 4 (four) times daily as needed for diarrhea or loose stools.      Menthol, Topical Analgesic, (BIOFREEZE COOL THE PAIN EX) Apply 1 application. topically 3 (three) times daily as needed (pain.).      Multiple Vitamins-Minerals (ADULT ONE DAILY GUMMIES PO) Take 2 tablets by mouth every evening.      oxymetazoline (AFRIN) 0.05 % nasal spray Place 1 spray into both nostrils See admin instructions. Mix with cerave and use as needed for outdoor exposure      penicillin v potassium (VEETID) 500 MG tablet Take 2,000 mg by mouth See admin instructions. Take 4 tablets (2000 mg) by mouth 1 hour prior to dental appointments      simethicone (MYLICON) 0000000 MG chewable tablet Chew 125 mg by mouth every 6 (six) hours as needed for flatulence.      solifenacin (VESICARE) 5 MG tablet Take 5 mg by mouth 2 (two) times daily as needed (bladder frequency).      triamterene-hydrochlorothiazide (MAXZIDE-25) 37.5-25 MG tablet Take 1 tablet by mouth every morning.  1    venlafaxine (EFFEXOR-XR) 150 MG 24 hr capsule Take 150 mg by mouth at bedtime.      Vitamins-Lipotropics (LIPOFLAVONOID) TABS Take 2 tablets by mouth at bedtime.      No current facility-administered medications for this visit.   Allergies  Allergen Reactions   Penicillins Other (See Comments)    Sick on  stomach, "hurts my stomach"   Augmentin [Amoxicillin-Pot Clavulanate] Nausea And Vomiting    Did it involve swelling of the face/tongue/throat, SOB, or low BP? No Did it involve sudden or severe rash/hives, skin peeling, or any reaction on the inside of your mouth or nose? No Did you need to seek medical attention at a hospital or doctor's office? No When did it last happen? Over 10 years ago If all above answers are "NO", may proceed with cephalosporin use.   Bactrim Nausea And Vomiting   Codeine Rash   Morphine And Related Rash   Percocet [Oxycodone-Acetaminophen] Other (See  Comments)    Hot flashes and hallucinations    Social History   Tobacco Use   Smoking status: Never   Smokeless tobacco: Never  Substance Use Topics   Alcohol use: No    Family History  Problem Relation Age of Onset   Hypertension Mother    Breast cancer Mother        93'S     Review of Systems  Genitourinary:        Has Interstitial cystitis  Musculoskeletal:  Positive for arthralgias, back pain, gait problem and myalgias.  All other systems reviewed and are negative.  Objective:  Physical Exam Constitutional:      Appearance: Normal appearance. She is obese.  HENT:     Head: Normocephalic.     Nose: Nose normal.     Mouth/Throat:     Mouth: Mucous membranes are moist.     Pharynx: Oropharynx is clear.  Eyes:     Extraocular Movements: Extraocular movements intact.  Cardiovascular:     Rate and Rhythm: Normal rate and regular rhythm.     Pulses: Normal pulses.     Heart sounds: Normal heart sounds.  Pulmonary:     Effort: Pulmonary effort is normal.     Breath sounds: Normal breath sounds.  Abdominal:     General: Abdomen is flat.     Palpations: Abdomen is soft.  Genitourinary:    Comments: deferred Musculoskeletal:     Cervical back: Normal range of motion and neck supple.     Comments: Right hip:  no skin wounds or lesions. She has moderately restricted range of motion of the  right hip. Pain with terminal flexion and rotation. She has trochanteric tenderness to palpation. Abductor strength is 3/5 with pain.  Left hip. Well healed surgical incision from anterior THA. Abductor strength 4/5.  Skin:    General: Skin is warm and dry.     Capillary Refill: Capillary refill takes less than 2 seconds.  Neurological:     General: No focal deficit present.     Mental Status: She is alert and oriented to person, place, and time.  Psychiatric:        Mood and Affect: Mood normal.        Behavior: Behavior normal.        Thought Content: Thought content normal.        Judgment: Judgment normal.    Vital signs in last 24 hours: @VSRANGES @  Labs:   Estimated body mass index is 38.36 kg/m as calculated from the following:   Height as of 07/05/19: 5\' 1"  (1.549 m).   Weight as of 07/05/19: 92.1 kg.   Imaging Review Plain radiographs demonstrate severe degenerative joint disease of the right hip(s). The bone quality appears to be adequate for age and reported activity level.      Assessment/Plan:  End stage arthritis, right hip(s)  The patient history, physical examination, clinical judgement of the provider and imaging studies are consistent with end stage degenerative joint disease of the right hip(s) and total hip arthroplasty is deemed medically necessary. The treatment options including medical management, injection therapy, arthroscopy and arthroplasty were discussed at length. The risks and benefits of total hip arthroplasty were presented and reviewed. The risks due to aseptic loosening, infection, stiffness, dislocation/subluxation,  thromboembolic complications and other imponderables were discussed.  The patient acknowledged the explanation, agreed to proceed with the plan and consent was signed. Patient is being admitted for inpatient treatment for surgery, pain control,  PT, OT, prophylactic antibiotics, VTE prophylaxis, progressive ambulation and ADL's  and discharge planning.The patient is planning to be discharged home with home exercise program.    Therapy Plans: HEP.  Disposition: Home with daughter and partner.  Planned DVT Prophylaxis: aspirin 81mg  BID.  DME needed: walker.  PCP: Cleared. Patient to hold estrogen 2 weeks prior to surgery due to VTE risk.  TXA: Yes Allergies:  - Augmentin- N/V - Bactrim- rash, stomach upset. - Codeine - rash - Morphine - rash - Penicillins- Stomach hurts - Percocet - hallucination, hot flashes Anesthesia Concerns: None??? BMI: 39.7 Last HgbA1c: 5.8 Other: - Left THA 2020.  - Cr: 0.92 - Interstitial cystitis - Fibromyalgia - Low back pain - Hydrocodone, ibuprofen.     Patient's anticipated LOS is less than 2 midnights, meeting these requirements: - Younger than 42 - Lives within 1 hour of care - Has a competent adult at home to recover with post-op recover - NO history of  - Chronic pain requiring opiods  - Diabetes  - Coronary Artery Disease  - Heart failure  - Heart attack  - Stroke  - DVT/VTE  - Cardiac arrhythmia  - Respiratory Failure/COPD  - Renal failure  - Anemia  - Advanced Liver disease

## 2021-11-28 ENCOUNTER — Encounter (HOSPITAL_COMMUNITY): Payer: Self-pay

## 2021-11-28 ENCOUNTER — Encounter (HOSPITAL_COMMUNITY)
Admission: RE | Admit: 2021-11-28 | Discharge: 2021-11-28 | Disposition: A | Discharge status: Home / Self Care | Payer: Medicare Other | Source: Ambulatory Visit | Attending: Orthopedic Surgery | Admitting: Orthopedic Surgery

## 2021-11-28 ENCOUNTER — Other Ambulatory Visit: Payer: Self-pay

## 2021-11-28 VITALS — BP 134/69 | HR 67 | Temp 98.3°F | Resp 16 | Ht 61.0 in | Wt 202.0 lb

## 2021-11-28 DIAGNOSIS — Z01818 Encounter for other preprocedural examination: Secondary | ICD-10-CM | POA: Insufficient documentation

## 2021-11-28 HISTORY — DX: Anxiety disorder, unspecified: F41.9

## 2021-11-28 LAB — CBC
HCT: 42.7 % (ref 36.0–46.0)
Hemoglobin: 14 g/dL (ref 12.0–15.0)
MCH: 29.7 pg (ref 26.0–34.0)
MCHC: 32.8 g/dL (ref 30.0–36.0)
MCV: 90.5 fL (ref 80.0–100.0)
Platelets: 325 10*3/uL (ref 150–400)
RBC: 4.72 MIL/uL (ref 3.87–5.11)
RDW: 13.7 % (ref 11.5–15.5)
WBC: 6.4 10*3/uL (ref 4.0–10.5)
nRBC: 0 % (ref 0.0–0.2)

## 2021-11-28 LAB — BASIC METABOLIC PANEL
Anion gap: 8 (ref 5–15)
BUN: 20 mg/dL (ref 8–23)
CO2: 28 mmol/L (ref 22–32)
Calcium: 9.3 mg/dL (ref 8.9–10.3)
Chloride: 104 mmol/L (ref 98–111)
Creatinine, Ser: 1 mg/dL (ref 0.44–1.00)
GFR, Estimated: >60 mL/min (ref >60)
Glucose, Bld: 91 mg/dL (ref 70–99)
Potassium: 3.7 mmol/L (ref 3.5–5.1)
Sodium: 140 mmol/L (ref 135–145)

## 2021-11-28 LAB — SURGICAL PCR SCREEN
MRSA, PCR: NEGATIVE
Staphylococcus aureus: NEGATIVE

## 2021-12-05 ENCOUNTER — Ambulatory Visit: Payer: Self-pay | Admitting: Student

## 2021-12-06 ENCOUNTER — Ambulatory Visit (HOSPITAL_COMMUNITY): Payer: Medicare Other | Admitting: Physician Assistant

## 2021-12-06 ENCOUNTER — Ambulatory Visit (HOSPITAL_BASED_OUTPATIENT_CLINIC_OR_DEPARTMENT_OTHER): Payer: Medicare Other | Admitting: Certified Registered Nurse Anesthetist

## 2021-12-06 ENCOUNTER — Other Ambulatory Visit: Payer: Self-pay

## 2021-12-06 ENCOUNTER — Ambulatory Visit (HOSPITAL_COMMUNITY): Payer: Medicare Other

## 2021-12-06 ENCOUNTER — Ambulatory Visit (HOSPITAL_COMMUNITY)
Admission: RE | Admit: 2021-12-06 | Discharge: 2021-12-06 | Disposition: A | Discharge status: Home / Self Care | Payer: Medicare Other | Attending: Orthopedic Surgery | Admitting: Orthopedic Surgery

## 2021-12-06 ENCOUNTER — Encounter (HOSPITAL_COMMUNITY)
Admission: RE | Disposition: A | Discharge status: Home / Self Care | Payer: Self-pay | Source: Home / Self Care | Attending: Orthopedic Surgery

## 2021-12-06 ENCOUNTER — Encounter (HOSPITAL_COMMUNITY): Payer: Self-pay | Admitting: Orthopedic Surgery

## 2021-12-06 DIAGNOSIS — M1612 Unilateral primary osteoarthritis, left hip: Secondary | ICD-10-CM | POA: Diagnosis present

## 2021-12-06 DIAGNOSIS — M797 Fibromyalgia: Secondary | ICD-10-CM | POA: Diagnosis not present

## 2021-12-06 DIAGNOSIS — Z96641 Presence of right artificial hip joint: Secondary | ICD-10-CM

## 2021-12-06 DIAGNOSIS — Z01818 Encounter for other preprocedural examination: Secondary | ICD-10-CM

## 2021-12-06 DIAGNOSIS — M1611 Unilateral primary osteoarthritis, right hip: Secondary | ICD-10-CM | POA: Diagnosis present

## 2021-12-06 DIAGNOSIS — I10 Essential (primary) hypertension: Secondary | ICD-10-CM | POA: Diagnosis not present

## 2021-12-06 DIAGNOSIS — E039 Hypothyroidism, unspecified: Secondary | ICD-10-CM | POA: Insufficient documentation

## 2021-12-06 HISTORY — PX: TOTAL HIP ARTHROPLASTY: SHX124

## 2021-12-06 LAB — TYPE AND SCREEN
ABO/RH(D): A NEG
Antibody Screen: NEGATIVE

## 2021-12-06 SURGERY — ARTHROPLASTY, HIP, TOTAL, ANTERIOR APPROACH
Anesthesia: Spinal | Site: Hip | Laterality: Right

## 2021-12-06 MED ORDER — POVIDONE-IODINE 10 % EX SWAB: 2.0000 "application " | Freq: Once | CUTANEOUS | Status: DC

## 2021-12-06 MED ORDER — PROMETHAZINE HCL 25 MG/ML IJ SOLN: 6.2500 mg | INTRAMUSCULAR | Status: DC | PRN

## 2021-12-06 MED ORDER — MIDAZOLAM HCL 2 MG/2ML IJ SOLN
INTRAMUSCULAR | Status: AC
  Filled 2021-12-06: qty 2

## 2021-12-06 MED ORDER — ONDANSETRON HCL 4 MG/2ML IJ SOLN
INTRAMUSCULAR | Status: AC
Start: 2021-12-06 — End: ?
  Filled 2021-12-06: qty 2

## 2021-12-06 MED ORDER — PROPOFOL 1000 MG/100ML IV EMUL
INTRAVENOUS | Status: AC
  Filled 2021-12-06: qty 100

## 2021-12-06 MED ORDER — LACTATED RINGERS IV SOLN: INTRAVENOUS | Status: DC

## 2021-12-06 MED ORDER — HYDROCODONE-ACETAMINOPHEN 7.5-325 MG PO TABS
1.0000 | ORAL_TABLET | ORAL | Status: DC | PRN
  Administered 2021-12-06: 2 via ORAL

## 2021-12-06 MED ORDER — EPHEDRINE SULFATE-NACL 50-0.9 MG/10ML-% IV SOSY
PREFILLED_SYRINGE | INTRAVENOUS | Status: DC | PRN
  Administered 2021-12-06: 7.5 mg via INTRAVENOUS

## 2021-12-06 MED ORDER — WATER FOR IRRIGATION, STERILE IR SOLN
Status: DC | PRN
  Administered 2021-12-06: 2000 mL

## 2021-12-06 MED ORDER — CHLORHEXIDINE GLUCONATE 0.12 % MT SOLN
15.0000 mL | Freq: Once | OROMUCOSAL | Status: AC
  Administered 2021-12-06: 15 mL via OROMUCOSAL

## 2021-12-06 MED ORDER — MORPHINE SULFATE (PF) 2 MG/ML IV SOLN: 0.5000 mg | INTRAVENOUS | Status: DC | PRN

## 2021-12-06 MED ORDER — PROPOFOL 10 MG/ML IV BOLUS
INTRAVENOUS | Status: DC | PRN
  Administered 2021-12-06 (×2): 30 mg via INTRAVENOUS

## 2021-12-06 MED ORDER — PROPOFOL 500 MG/50ML IV EMUL
INTRAVENOUS | Status: DC | PRN
  Administered 2021-12-06: 125 ug/kg/min via INTRAVENOUS

## 2021-12-06 MED ORDER — CEFAZOLIN SODIUM-DEXTROSE 2-4 GM/100ML-% IV SOLN: 2.0000 g | Freq: Four times a day (QID) | INTRAVENOUS | Status: DC

## 2021-12-06 MED ORDER — BUPIVACAINE IN DEXTROSE 0.75-8.25 % IT SOLN
INTRATHECAL | Status: DC | PRN
  Administered 2021-12-06: 2 mL via INTRATHECAL

## 2021-12-06 MED ORDER — ASPIRIN 81 MG PO CHEW: 81.0000 mg | CHEWABLE_TABLET | Freq: Two times a day (BID) | ORAL | 0 refills | Status: AC

## 2021-12-06 MED ORDER — ISOPROPYL ALCOHOL 70 % SOLN
Status: AC
  Filled 2021-12-06: qty 480

## 2021-12-06 MED ORDER — METHOCARBAMOL 500 MG IVPB - SIMPLE MED
500.0000 mg | Freq: Four times a day (QID) | INTRAVENOUS | Status: DC | PRN
  Administered 2021-12-06: 500 mg via INTRAVENOUS

## 2021-12-06 MED ORDER — SODIUM CHLORIDE (PF) 0.9 % IJ SOLN
INTRAMUSCULAR | Status: DC | PRN
  Administered 2021-12-06: 30 mL via INTRAVENOUS

## 2021-12-06 MED ORDER — ONDANSETRON HCL 4 MG/2ML IJ SOLN
INTRAMUSCULAR | Status: DC | PRN
  Administered 2021-12-06: 4 mg via INTRAVENOUS

## 2021-12-06 MED ORDER — MELOXICAM 15 MG PO TABS: 15.0000 mg | ORAL_TABLET | Freq: Every day | ORAL | 2 refills | Status: AC

## 2021-12-06 MED ORDER — LACTATED RINGERS IV BOLUS
500.0000 mL | Freq: Once | INTRAVENOUS | Status: AC
Start: 2021-12-06 — End: 2021-12-06
  Administered 2021-12-06: 500 mL via INTRAVENOUS

## 2021-12-06 MED ORDER — HYDROMORPHONE HCL 1 MG/ML IJ SOLN
0.2500 mg | INTRAMUSCULAR | Status: DC | PRN
  Administered 2021-12-06: 0.5 mg via INTRAVENOUS

## 2021-12-06 MED ORDER — KETOROLAC TROMETHAMINE 30 MG/ML IJ SOLN
INTRAMUSCULAR | Status: AC
  Filled 2021-12-06: qty 1

## 2021-12-06 MED ORDER — DOCUSATE SODIUM 100 MG PO CAPS: 100.0000 mg | ORAL_CAPSULE | Freq: Two times a day (BID) | ORAL | 0 refills | Status: AC

## 2021-12-06 MED ORDER — FENTANYL CITRATE (PF) 100 MCG/2ML IJ SOLN
INTRAMUSCULAR | Status: DC | PRN
  Administered 2021-12-06 (×2): 50 ug via INTRAVENOUS

## 2021-12-06 MED ORDER — METHOCARBAMOL 500 MG PO TABS: 500.0000 mg | ORAL_TABLET | Freq: Four times a day (QID) | ORAL | Status: DC | PRN

## 2021-12-06 MED ORDER — HYDROCODONE-ACETAMINOPHEN 10-325 MG PO TABS
0.5000 | ORAL_TABLET | ORAL | 0 refills | Status: AC | PRN
Start: 2021-12-06 — End: 2021-12-13

## 2021-12-06 MED ORDER — BUPIVACAINE-EPINEPHRINE (PF) 0.25% -1:200000 IJ SOLN
INTRAMUSCULAR | Status: AC
  Filled 2021-12-06: qty 30

## 2021-12-06 MED ORDER — HYDROMORPHONE HCL 1 MG/ML IJ SOLN
INTRAMUSCULAR | Status: AC
  Filled 2021-12-06: qty 1

## 2021-12-06 MED ORDER — LACTATED RINGERS IV BOLUS
250.0000 mL | Freq: Once | INTRAVENOUS | Status: AC
Start: 2021-12-06 — End: 2021-12-06
  Administered 2021-12-06: 250 mL via INTRAVENOUS

## 2021-12-06 MED ORDER — SENNA 8.6 MG PO TABS: 2.0000 | ORAL_TABLET | Freq: Every day | ORAL | 0 refills | Status: AC

## 2021-12-06 MED ORDER — TRANEXAMIC ACID-NACL 1000-0.7 MG/100ML-% IV SOLN
1000.0000 mg | INTRAVENOUS | Status: AC
  Administered 2021-12-06: 1000 mg via INTRAVENOUS
  Filled 2021-12-06: qty 100

## 2021-12-06 MED ORDER — FENTANYL CITRATE (PF) 100 MCG/2ML IJ SOLN
INTRAMUSCULAR | Status: AC
  Filled 2021-12-06: qty 2

## 2021-12-06 MED ORDER — MIDAZOLAM HCL 5 MG/5ML IJ SOLN
INTRAMUSCULAR | Status: DC | PRN
  Administered 2021-12-06: 2 mg via INTRAVENOUS

## 2021-12-06 MED ORDER — PHENYLEPHRINE HCL-NACL 20-0.9 MG/250ML-% IV SOLN
INTRAVENOUS | Status: AC
  Filled 2021-12-06: qty 500

## 2021-12-06 MED ORDER — KETOROLAC TROMETHAMINE 30 MG/ML IJ SOLN
INTRAMUSCULAR | Status: DC | PRN
  Administered 2021-12-06: 30 mg via INTRAMUSCULAR

## 2021-12-06 MED ORDER — HYDROMORPHONE HCL 2 MG PO TABS: 2.0000 mg | ORAL_TABLET | ORAL | 0 refills | Status: AC | PRN

## 2021-12-06 MED ORDER — SODIUM CHLORIDE (PF) 0.9 % IJ SOLN
INTRAMUSCULAR | Status: AC
Start: 2021-12-06 — End: ?
  Filled 2021-12-06: qty 30

## 2021-12-06 MED ORDER — HYDROCODONE-ACETAMINOPHEN 7.5-325 MG PO TABS
ORAL_TABLET | ORAL | Status: AC
  Filled 2021-12-06: qty 2

## 2021-12-06 MED ORDER — POVIDONE-IODINE 10 % EX SWAB
2.0000 "application " | Freq: Once | CUTANEOUS | Status: AC
  Administered 2021-12-06: 2 via TOPICAL

## 2021-12-06 MED ORDER — EPHEDRINE 5 MG/ML INJ
INTRAVENOUS | Status: AC
  Filled 2021-12-06: qty 5

## 2021-12-06 MED ORDER — ONDANSETRON HCL 4 MG PO TABS: 4.0000 mg | ORAL_TABLET | Freq: Three times a day (TID) | ORAL | 0 refills | Status: DC | PRN

## 2021-12-06 MED ORDER — PHENYLEPHRINE HCL-NACL 20-0.9 MG/250ML-% IV SOLN
INTRAVENOUS | Status: DC | PRN
  Administered 2021-12-06: 30 ug/min via INTRAVENOUS

## 2021-12-06 MED ORDER — ORAL CARE MOUTH RINSE: 15.0000 mL | Freq: Once | OROMUCOSAL | Status: AC

## 2021-12-06 MED ORDER — HYDROCODONE-ACETAMINOPHEN 5-325 MG PO TABS: 1.0000 | ORAL_TABLET | ORAL | Status: DC | PRN

## 2021-12-06 MED ORDER — SODIUM CHLORIDE 0.9 % IR SOLN
Status: DC | PRN
  Administered 2021-12-06: 1000 mL

## 2021-12-06 MED ORDER — PROPOFOL 10 MG/ML IV BOLUS
INTRAVENOUS | Status: AC
  Filled 2021-12-06: qty 20

## 2021-12-06 MED ORDER — HYDROMORPHONE HCL 2 MG PO TABS
ORAL_TABLET | ORAL | Status: AC
  Filled 2021-12-06: qty 1

## 2021-12-06 MED ORDER — ISOPROPYL ALCOHOL 70 % SOLN
Status: DC | PRN
  Administered 2021-12-06: 1 via TOPICAL

## 2021-12-06 MED ORDER — HYDROMORPHONE HCL 2 MG PO TABS
2.0000 mg | ORAL_TABLET | ORAL | Status: DC | PRN
  Administered 2021-12-06: 2 mg via ORAL

## 2021-12-06 MED ORDER — ACETAMINOPHEN 500 MG PO TABS
1000.0000 mg | ORAL_TABLET | Freq: Once | ORAL | Status: AC
  Administered 2021-12-06: 1000 mg via ORAL
  Filled 2021-12-06: qty 2

## 2021-12-06 MED ORDER — CEFAZOLIN SODIUM-DEXTROSE 2-4 GM/100ML-% IV SOLN
2.0000 g | INTRAVENOUS | Status: AC
  Administered 2021-12-06: 2 g via INTRAVENOUS
  Filled 2021-12-06: qty 100

## 2021-12-06 MED ORDER — POLYETHYLENE GLYCOL 3350 17 G PO PACK: 17.0000 g | PACK | Freq: Every day | ORAL | 0 refills | Status: AC | PRN

## 2021-12-06 MED ORDER — BUPIVACAINE-EPINEPHRINE 0.25% -1:200000 IJ SOLN
INTRAMUSCULAR | Status: DC | PRN
  Administered 2021-12-06: 30 mL

## 2021-12-06 SURGICAL SUPPLY — 64 items
ACE SHELL 3H 52 E HIP (Shell) ×2 IMPLANT
ADH SKN CLS APL DERMABOND .7 (GAUZE/BANDAGES/DRESSINGS) ×1
APL PRP STRL LF DISP 70% ISPRP (MISCELLANEOUS) ×1
BAG COUNTER SPONGE SURGICOUNT (BAG) ×1 IMPLANT
BAG DECANTER FOR FLEXI CONT (MISCELLANEOUS) IMPLANT
BAG SPEC THK2 15X12 ZIP CLS (MISCELLANEOUS)
BAG SPNG CNTER NS LX DISP (BAG) ×1
BAG ZIPLOCK 12X15 (MISCELLANEOUS) IMPLANT
CHLORAPREP W/TINT 26 (MISCELLANEOUS) ×2 IMPLANT
COVER PERINEAL POST (MISCELLANEOUS) ×2 IMPLANT
COVER SURGICAL LIGHT HANDLE (MISCELLANEOUS) ×2 IMPLANT
DERMABOND ADVANCED (GAUZE/BANDAGES/DRESSINGS) ×1
DERMABOND ADVANCED .7 DNX12 (GAUZE/BANDAGES/DRESSINGS) ×2 IMPLANT
DRAPE IMP U-DRAPE 54X76 (DRAPES) ×2 IMPLANT
DRAPE SHEET LG 3/4 BI-LAMINATE (DRAPES) ×6 IMPLANT
DRAPE STERI IOBAN 125X83 (DRAPES) ×2 IMPLANT
DRAPE U-SHAPE 47X51 STRL (DRAPES) ×4 IMPLANT
DRSG AQUACEL AG ADV 3.5X10 (GAUZE/BANDAGES/DRESSINGS) ×2 IMPLANT
ELECT REM PT RETURN 15FT ADLT (MISCELLANEOUS) ×2 IMPLANT
GAUZE SPONGE 4X4 12PLY STRL (GAUZE/BANDAGES/DRESSINGS) ×2 IMPLANT
GLOVE BIO SURGEON STRL SZ8.5 (GLOVE) ×4 IMPLANT
GLOVE BIOGEL M 7.0 STRL (GLOVE) ×2 IMPLANT
GLOVE BIOGEL PI IND STRL 7.5 (GLOVE) ×1 IMPLANT
GLOVE BIOGEL PI IND STRL 8 (GLOVE) ×1 IMPLANT
GLOVE BIOGEL PI IND STRL 8.5 (GLOVE) ×1 IMPLANT
GLOVE BIOGEL PI INDICATOR 7.5 (GLOVE) ×1
GLOVE BIOGEL PI INDICATOR 8 (GLOVE) ×1
GLOVE BIOGEL PI INDICATOR 8.5 (GLOVE) ×1
GLOVE SURG LX 7.5 STRW (GLOVE) ×2
GLOVE SURG LX STRL 7.5 STRW (GLOVE) ×2 IMPLANT
GOWN SPEC L3 XXLG W/TWL (GOWN DISPOSABLE) ×4 IMPLANT
HANDPIECE INTERPULSE COAX TIP (DISPOSABLE) ×2
HEAD CERAMIC BIOLOX 32 TP1 -3 (Head) ×1 IMPLANT
HOLDER FOLEY CATH W/STRAP (MISCELLANEOUS) ×2 IMPLANT
HOOD PEEL AWAY FLYTE STAYCOOL (MISCELLANEOUS) ×6 IMPLANT
KIT TURNOVER KIT A (KITS) ×1 IMPLANT
LINER ACETAB NEUTRAL G7 32 E (Liner) ×1 IMPLANT
MANIFOLD NEPTUNE II (INSTRUMENTS) ×2 IMPLANT
MARKER SKIN DUAL TIP RULER LAB (MISCELLANEOUS) ×2 IMPLANT
NDL SAFETY ECLIPSE 18X1.5 (NEEDLE) ×1 IMPLANT
NDL SPNL 18GX3.5 QUINCKE PK (NEEDLE) ×1 IMPLANT
NEEDLE HYPO 18GX1.5 SHARP (NEEDLE) ×2
NEEDLE SPNL 18GX3.5 QUINCKE PK (NEEDLE) ×2 IMPLANT
PACK ANTERIOR HIP CUSTOM (KITS) ×2 IMPLANT
PENCIL SMOKE EVACUATOR (MISCELLANEOUS) IMPLANT
SAW OSC TIP CART 19.5X105X1.3 (SAW) ×2 IMPLANT
SEALER BIPOLAR AQUA 6.0 (INSTRUMENTS) ×2 IMPLANT
SET HNDPC FAN SPRY TIP SCT (DISPOSABLE) ×1 IMPLANT
SHELL ACETAB 3H 52 E HIP (Shell) IMPLANT
SOLUTION PRONTOSAN WOUND 350ML (IRRIGATION / IRRIGATOR) ×2 IMPLANT
SPIKE FLUID TRANSFER (MISCELLANEOUS) ×5 IMPLANT
STAPLER INSORB 30 2030 C-SECTI (MISCELLANEOUS) IMPLANT
STEM FEM REDUCE DISTAL 11X107 (Stem) ×1 IMPLANT
SUT MNCRL AB 3-0 PS2 18 (SUTURE) ×2 IMPLANT
SUT MON AB 2-0 CT1 36 (SUTURE) ×2 IMPLANT
SUT STRATAFIX PDO 1 14 VIOLET (SUTURE) ×2
SUT STRATFX PDO 1 14 VIOLET (SUTURE) ×1
SUT VIC AB 2-0 CT1 27 (SUTURE)
SUT VIC AB 2-0 CT1 TAPERPNT 27 (SUTURE) IMPLANT
SUTURE STRATFX PDO 1 14 VIOLET (SUTURE) ×1 IMPLANT
SYR 3ML LL SCALE MARK (SYRINGE) ×2 IMPLANT
TRAY FOLEY MTR SLVR 16FR STAT (SET/KITS/TRAYS/PACK) ×1 IMPLANT
TUBE SUCTION HIGH CAP CLEAR NV (SUCTIONS) ×2 IMPLANT
WATER STERILE IRR 1000ML POUR (IV SOLUTION) ×2 IMPLANT

## 2021-12-06 NOTE — Op Note (Signed)
OPERATIVE REPORT  SURGEON: Rod Can, MD   ASSISTANT: Larene Pickett, PA-C.  PREOPERATIVE DIAGNOSIS: Right hip arthritis.   POSTOPERATIVE DIAGNOSIS: Right hip arthritis.   PROCEDURE: Right total hip arthroplasty, anterior approach.   IMPLANTS: Biomet Taperloc Complete Microplasty stem, size 11 x 107.5 mm, high offset. Biomet G7 OsseoTi Cup, size 52 mm. Biomet Vivacit-E liner, size 32 mm, E, neutral. Biomet Biolox ceramic head ball, size 32 - 3 mm.  ANESTHESIA:  MAC, Regional, and Spinal  ESTIMATED BLOOD LOSS:-100 mL    ANTIBIOTICS: 2 g Ancef.  DRAINS: None.  COMPLICATIONS: None.   CONDITION: PACU - hemodynamically stable.   BRIEF CLINICAL NOTE: [name] is a 66 y.o. female with a long-standing history of Right hip arthritis. After failing conservative management, the patient was indicated for total hip arthroplasty. The risks, benefits, and alternatives to the procedure were explained, and the patient elected to proceed.  PROCEDURE IN DETAIL: Surgical site was marked by myself in the pre-op holding area. Once inside the operating room, spinal anesthesia was obtained, and a foley catheter was inserted. The patient was then positioned on the Hana table.  All bony prominences were well padded.  The hip was prepped and draped in the normal sterile surgical fashion.  A time-out was called verifying side and site of surgery. The patient received IV antibiotics within 60 minutes of beginning the procedure.   Bikini incision was made, and superficial dissection was performed lateral to the ASIS. The direct anterior approach to the hip was performed through the Hueter interval.  Lateral femoral circumflex vessels were treated with the Auqumantys. The anterior capsule was exposed and an inverted T capsulotomy was made. The femoral neck cut was made to the level of the templated cut.  A corkscrew was placed into the head and the head was removed.  The femoral head was found to have  eburnated bone. The head was passed to the back table and was measured. Pubofemoral ligament was released off of the calcar, taking care to stay on bone. Superior capsule was released from the greater trochanter, taking care to stay lateral to the posterior border of the femoral neck in order to preserve the short external rotators.   Acetabular exposure was achieved, and the pulvinar and labrum were excised. Sequential reaming of the acetabulum was then performed up to a size 51 mm reamer. A 52 mm cup was then opened and impacted into place at approximately 40 degrees of abduction and 20 degrees of anteversion. The final polyethylene liner was impacted into place and acetabular osteophytes were removed.    I then gained femoral exposure taking care to protect the abductors and greater trochanter.  This was performed using standard external rotation, extension, and adduction.  A cookie cutter was used to enter the femoral canal, and then the femoral canal finder was placed.  Sequential broaching was performed up to a size 11.  Calcar planer was used on the femoral neck remnant.  I placed a high offset neck and a trial head ball.  The hip was reduced.  Leg lengths and offset were checked fluoroscopically.  The hip was dislocated and trial components were removed.  The final implants were placed, and the hip was reduced.  Fluoroscopy was used to confirm component position and leg lengths.  At 90 degrees of external rotation and full extension, the hip was stable to an anterior directed force.   The wound was copiously irrigated with Prontosan solution and normal saline using pule  lavage.  Marcaine solution was injected into the periarticular soft tissue.  The wound was closed in layers using #1 Stratafix for the fascia, 2-0 Vicryl for the subcutaneous fat, 2-0 Monocryl for the deep dermal layer, 3-0 running Monocryl subcuticular stitch, and Dermabond for the skin.  Once the glue was fully dried, an Aquacell Ag  dressing was applied.  The patient was transported to the recovery room in stable condition.  Sponge, needle, and instrument counts were correct at the end of the case x2.  The patient tolerated the procedure well and there were no known complications.  Please note that a surgical assistant was a medical necessity for this procedure to perform it in a safe and expeditious manner. Assistant was necessary to provide appropriate retraction of vital neurovascular structures, to prevent femoral fracture, and to allow for anatomic placement of the prosthesis.

## 2021-12-06 NOTE — Anesthesia Procedure Notes (Signed)
Procedure Name: MAC Date/Time: 12/06/2021 7:50 AM Performed by: Claudia Desanctis, CRNA Pre-anesthesia Checklist: Patient identified, Emergency Drugs available, Suction available and Patient being monitored Patient Re-evaluated:Patient Re-evaluated prior to induction Oxygen Delivery Method: Simple face mask

## 2021-12-06 NOTE — Interval H&P Note (Signed)
History and Physical Interval Note:  12/06/2021 7:18 AM  [name]  has presented today for surgery, with the diagnosis of Right hip osteoarthritis.  The various methods of treatment have been discussed with the patient and family. After consideration of risks, benefits and other options for treatment, the patient has consented to  Procedure(s) with comments: [place] (Right) - 150 as a surgical intervention.  The patient's history has been reviewed, patient examined, no change in status, stable for surgery.  I have reviewed the patient's chart and labs.  Questions were answered to the patient's satisfaction.     Hilton Cork Maricela Schreur

## 2021-12-06 NOTE — Transfer of Care (Signed)
Immediate Anesthesia Transfer of Care Note  Patient: [name]  Procedure(s) Performed: TOTAL HIP ARTHROPLASTY ANTERIOR APPROACH (Right: Hip)  Patient Location: PACU  Anesthesia Type:Spinal  Level of Consciousness: awake and patient cooperative  Airway & Oxygen Therapy: Patient Spontanous Breathing and Patient connected to face mask  Post-op Assessment: Report given to RN and Post -op Vital signs reviewed and stable  Post vital signs: Reviewed and stable  Last Vitals:  Vitals Value Taken Time  BP 101/55 12/06/21 0945  Temp    Pulse 80 12/06/21 0946  Resp 14 12/06/21 0946  SpO2 100 % 12/06/21 0946  Vitals shown include unvalidated device data.  Last Pain:  Vitals:   12/06/21 0554  TempSrc: Oral         Complications: No notable events documented.

## 2021-12-06 NOTE — Evaluation (Signed)
Physical Therapy Evaluation Patient Details Name: [name] MRN: [id number] DOB: 02/02/1956 Today's Date: 12/06/2021  History of Present Illness  Pt is a 66yo female s/p R-THA, AA on 12/06/21. PMH: OA, anxiety & depression, fibromyalgia, HTN, Lumbar laminectomy 2019, L-THA 2020, b/l CTR.  Clinical Impression  [name] is a 66 y.o. female POD 0 s/p R-THA, AA. Patient reports modified independence using SPC for mobility at baseline. Patient is now limited by functional impairments (see PT problem list below) and requires min guard for transfers and gait with RW. Patient was able to ambulate 60 feet with RW and min guard and cues for safe walker management. Patient educated on safe sequencing for stair mobility and verbalized safe guarding position for people assisting with mobility. Patient instructed in exercises to facilitate ROM and circulation. Patient will benefit from continued skilled PT interventions to address impairments and progress towards PLOF. Patient has met mobility goals at adequate level for discharge home; will continue to follow if pt continues acute stay to progress towards Mod I goals.       Recommendations for follow up therapy are one component of a multi-disciplinary discharge planning process, led by the attending physician.  Recommendations may be updated based on patient status, additional functional criteria and insurance authorization.  Follow Up Recommendations Follow physician's recommendations for discharge plan and follow up therapies    Assistance Recommended at Discharge Intermittent Supervision/Assistance  Patient can return home with the following  A little help with walking and/or transfers;A little help with bathing/dressing/bathroom;Assistance with cooking/housework;Assist for transportation;Help with stairs or ramp for entrance    Equipment Recommendations None recommended by PT  Recommendations for Other Services       Functional Status Assessment  Patient has had a recent decline in their functional status and demonstrates the ability to make significant improvements in function in a reasonable and predictable amount of time.     Precautions / Restrictions Precautions Precautions: Fall Restrictions Weight Bearing Restrictions: No Other Position/Activity Restrictions: wbat      Mobility  Bed Mobility Overal bed mobility: Needs Assistance Bed Mobility: Supine to Sit     Supine to sit: Min assist     General bed mobility comments: Pt using PT UE to steady and pull up to sitting position.    Transfers Overall transfer level: Needs assistance Equipment used: Rolling walker (2 wheels) Transfers: Sit to/from Stand Sit to Stand: Min guard           General transfer comment: Pt min guard for safety only, no physical assist required    Ambulation/Gait Ambulation/Gait assistance: Min guard Gait Distance (Feet): 60 Feet Assistive device: Rolling walker (2 wheels) Gait Pattern/deviations: Step-to pattern       General Gait Details: Pt ambulated with RW and min guard, no physical assist required or overt LOB noted. Demonstrated step-to pattern with expressions of pain, antalgia, decreased cadence.  Stairs Stairs: Yes Stairs assistance: Min guard Stair Management: Two rails, Step to pattern, Forwards Number of Stairs: 2 General stair comments: Pt instructed on safe stair mobility, verbalized understanding. Pt completed stair training with min guard assist +2 for additional safety, VCs for sequencing. At first step, pt lifted RLE and flex forward at the hip in pain, PT guarding closely and carefully without LOB, pt reporting "I'm just getting the pain out."  Wheelchair Mobility    Modified Rankin (Stroke Patients Only)       Balance Overall balance assessment: Needs assistance Sitting-balance support: Feet supported, No  upper extremity supported Sitting balance-Leahy Scale: Fair     Standing balance support:  Reliant on assistive device for balance, During functional activity, Bilateral upper extremity supported Standing balance-Leahy Scale: Poor                               Pertinent Vitals/Pain Pain Assessment Pain Assessment: 0-10 Pain Score: 8  Pain Location: left hip Pain Descriptors / Indicators: Operative site guarding, Discomfort, Grimacing, Burning Pain Intervention(s): Limited activity within patient's tolerance, Monitored during session, Repositioned, Ice applied    Home Living Family/patient expects to be discharged to:: Private residence Living Arrangements: Spouse/significant other Available Help at Discharge: Available 24 hours/day Type of Home: House Home Access: Stairs to enter Entrance Stairs-Rails: Psychiatric nurse of Steps: 3   Home Layout: One level Home Equipment: Conservation officer, nature (2 wheels);Cane - single point;BSC/3in1;Shower seat      Prior Function Prior Level of Function : Independent/Modified Independent             Mobility Comments: SPC all the time ADLs Comments: ind     Hand Dominance        Extremity/Trunk Assessment   Upper Extremity Assessment Upper Extremity Assessment: Overall WFL for tasks assessed    Lower Extremity Assessment Lower Extremity Assessment: RLE deficits/detail;LLE deficits/detail RLE Deficits / Details: MMT ank DF/PF 4/5, limited ank DF ROM RLE Sensation: WNL LLE Deficits / Details: MMT ank DF/PF 4/5, limited ank DF ROM LLE Sensation: WNL    Cervical / Trunk Assessment Cervical / Trunk Assessment: Back Surgery;Kyphotic  Communication   Communication: No difficulties  Cognition Arousal/Alertness: Awake/alert Behavior During Therapy: WFL for tasks assessed/performed Overall Cognitive Status: Within Functional Limits for tasks assessed                                          General Comments      Exercises Total Joint Exercises Ankle Circles/Pumps: AROM,  Both, 5 reps Quad Sets: AROM, Other reps (comment), Both (2) Short Arc Quad: AROM, Right, Other reps (comment) (3) Heel Slides: AAROM, Right, 5 reps Hip ABduction/ADduction: AAROM, Right, 5 reps   Assessment/Plan    PT Assessment Patient needs continued PT services  PT Problem List Decreased strength;Decreased range of motion;Decreased activity tolerance;Decreased balance;Decreased mobility;Decreased coordination;Decreased knowledge of use of DME;Pain;Decreased safety awareness       PT Treatment Interventions DME instruction;Gait training;Stair training;Functional mobility training;Therapeutic activities;Therapeutic exercise;Balance training;Neuromuscular re-education;Patient/family education    PT Goals (Current goals can be found in the Care Plan section)  Acute Rehab PT Goals Patient Stated Goal: To walk without pain PT Goal Formulation: With patient Time For Goal Achievement: 12/13/21 Potential to Achieve Goals: Good    Frequency 7X/week     Co-evaluation               AM-PAC PT "6 Clicks" Mobility  Outcome Measure Help needed turning from your back to your side while in a flat bed without using bedrails?: None Help needed moving from lying on your back to sitting on the side of a flat bed without using bedrails?: A Little Help needed moving to and from a bed to a chair (including a wheelchair)?: A Little Help needed standing up from a chair using your arms (e.g., wheelchair or bedside chair)?: A Little Help needed to walk in hospital room?: A Little  Help needed climbing 3-5 steps with a railing? : A Little 6 Click Score: 19    End of Session Equipment Utilized During Treatment: Gait belt Activity Tolerance: Patient tolerated treatment well Patient left: in chair;with call bell/phone within reach Nurse Communication: Mobility status PT Visit Diagnosis: Difficulty in walking, not elsewhere classified (R26.2);Pain Pain - Right/Left: Right Pain - part of body:  Hip    Time: 1210-1250 PT Time Calculation (min) (ACUTE ONLY): 40 min   Charges:   PT Evaluation $PT Eval Low Complexity: 1 Low PT Treatments $Gait Training: 8-22 mins $Therapeutic Exercise: 8-22 mins        Coolidge Breeze, PT, DPT [facility] Office: [phone] Pager: [phone]  Coolidge Breeze 12/06/2021, 1:37 PM

## 2021-12-06 NOTE — Discharge Instructions (Signed)
? ?Dr. Brian Swinteck ?Joint Replacement Specialist ?[facility] ?[address] ?[place], [place] [address] ?[phone] ? ? ?TOTAL HIP REPLACEMENT POSTOPERATIVE DIRECTIONS ? ? ? ?Hip Rehabilitation, Guidelines Following Surgery  ? ?WEIGHT BEARING ?Weight bearing as tolerated with assist device (walker, cane, etc) as directed, use it as long as suggested by your surgeon or therapist, typically at least 4-6 weeks. ? ?The results of a hip operation are greatly improved after range of motion and muscle strengthening exercises. Follow all safety measures which are given to protect your hip. If any of these exercises cause increased pain or swelling in your joint, decrease the amount until you are comfortable again. Then slowly increase the exercises. Call your caregiver if you have problems or questions.  ? ?HOME CARE INSTRUCTIONS  ?Most of the following instructions are designed to prevent the dislocation of your new hip.  ?Remove items at home which could result in a fall. This includes throw rugs or furniture in walking pathways.  ?Continue medications as instructed at time of discharge. ?You may have some home medications which will be placed on hold until you complete the course of blood thinner medication. ?You may start showering once you are discharged home. Do not remove your dressing. ?Do not put on socks or shoes without following the instructions of your caregivers.   ?Sit on chairs with arms. Use the chair arms to help push yourself up when arising.  ?Arrange for the use of a toilet seat elevator so you are not sitting low.  ?Walk with walker as instructed.  ?You may resume a sexual relationship in one month or when given the OK by your caregiver.  ?Use walker as long as suggested by your caregivers.  ?You may put full weight on your legs and walk as much as is comfortable. ?Avoid periods of inactivity such as sitting longer than an hour when not asleep. This helps prevent blood  clots.  ?You may return to work once you are cleared by your surgeon.  ?Do not drive a car for 6 weeks or until released by your surgeon.  ?Do not drive while taking narcotics.  ?Wear elastic stockings for two weeks following surgery during the day but you may remove then at night.  ?Make sure you keep all of your appointments after your operation with all of your doctors and caregivers. You should call the office at the above phone number and make an appointment for approximately two weeks after the date of your surgery. ?Please pick up a stool softener and laxative for home use as long as you are requiring pain medications. ?ICE to the affected hip every three hours for 30 minutes at a time and then as needed for pain and swelling. Continue to use ice on the hip for pain and swelling from surgery. You may notice swelling that will progress down to the foot and ankle.  This is normal after surgery.  Elevate the leg when you are not up walking on it.   ?It is important for you to complete the blood thinner medication as prescribed by your doctor. ?Continue to use the breathing machine which will help keep your temperature down.  It is common for your temperature to cycle up and down following surgery, especially at night when you are not up moving around and exerting yourself.  The breathing machine keeps your lungs expanded and your temperature down. ? ?RANGE OF MOTION AND STRENGTHENING EXERCISES  ?These exercises are designed to help you   keep full movement of your hip joint. Follow your caregiver's or physical therapist's instructions. Perform all exercises about fifteen times, three times per day or as directed. Exercise both hips, even if you have had only one joint replacement. These exercises can be done on a training (exercise) mat, on the floor, on a table or on a bed. Use whatever works the best and is most comfortable for you. Use music or television while you are exercising so that the exercises are a  pleasant break in your day. This will make your life better with the exercises acting as a break in routine you can look forward to.  ?Lying on your back, slowly slide your foot toward your buttocks, raising your knee up off the floor. Then slowly slide your foot back down until your leg is straight again.  ?Lying on your back spread your legs as far apart as you can without causing discomfort.  ?Lying on your side, raise your upper leg and foot straight up from the floor as far as is comfortable. Slowly lower the leg and repeat.  ?Lying on your back, tighten up the muscle in the front of your thigh (quadriceps muscles). You can do this by keeping your leg straight and trying to raise your heel off the floor. This helps strengthen the largest muscle supporting your knee.  ?Lying on your back, tighten up the muscles of your buttocks both with the legs straight and with the knee bent at a comfortable angle while keeping your heel on the floor.  ? ?SKILLED REHAB INSTRUCTIONS: ?If the patient is transferred to a skilled rehab facility following release from the hospital, a list of the current medications will be sent to the facility for the patient to continue.  When discharged from the skilled rehab facility, please have the facility set up the patient's Home Health Physical Therapy prior to being released. Also, the skilled facility will be responsible for providing the patient with their medications at time of release from the facility to include their pain medication and their blood thinner medication. If the patient is still at the rehab facility at time of the two week follow up appointment, the skilled rehab facility will also need to assist the patient in arranging follow up appointment in our office and any transportation needs. ? ?POST-OPERATIVE OPIOID TAPER INSTRUCTIONS: ?It is important to wean off of your opioid medication as soon as possible. If you do not need pain medication after your surgery it is ok  to stop day one. ?Opioids include: ?Codeine, Hydrocodone(Norco, Vicodin), Oxycodone(Percocet, oxycontin) and hydromorphone amongst others.  ?Long term and even short term use of opiods can cause: ?Increased pain response ?Dependence ?Constipation ?Depression ?Respiratory depression ?And more.  ?Withdrawal symptoms can include ?Flu like symptoms ?Nausea, vomiting ?And more ?Techniques to manage these symptoms ?Hydrate well ?Eat regular healthy meals ?Stay active ?Use relaxation techniques(deep breathing, meditating, yoga) ?Do Not substitute Alcohol to help with tapering ?If you have been on opioids for less than two weeks and do not have pain than it is ok to stop all together.  ?Plan to wean off of opioids ?This plan should start within one week post op of your joint replacement. ?Maintain the same interval or time between taking each dose and first decrease the dose.  ?Cut the total daily intake of opioids by one tablet each day ?Next start to increase the time between doses. ?The last dose that should be eliminated is the evening dose.  ? ? ?MAKE   SURE YOU:  ?Understand these instructions.  ?Will watch your condition.  ?Will get help right away if you are not doing well or get worse. ? ?Pick up stool softner and laxative for home use following surgery while on pain medications. ?Do not remove your dressing. ?The dressing is waterproof--it is OK to take showers. ?Continue to use ice for pain and swelling after surgery. ?Do not use any lotions or creams on the incision until instructed by your surgeon. ?Total Hip Protocol. ? ?

## 2021-12-06 NOTE — Anesthesia Procedure Notes (Signed)
Spinal  Patient location during procedure: OR Reason for block: surgical anesthesia Staffing Performed: resident/CRNA  Anesthesiologist: Lowella Curb, MD Resident/CRNA: Vanessa , CRNA Preanesthetic Checklist Completed: patient identified, IV checked, site marked, risks and benefits discussed, surgical consent, monitors and equipment checked, pre-op evaluation and timeout performed Spinal Block Patient position: sitting Prep: DuraPrep Patient monitoring: heart rate, cardiac monitor, continuous pulse ox and blood pressure Approach: midline Location: L3-4 Injection technique: single-shot Needle Needle type: Pencan  Needle gauge: 24 G Needle length: 10 cm Needle insertion depth: 9 cm Assessment Sensory level: T4 Events: CSF return

## 2021-12-06 NOTE — Anesthesia Postprocedure Evaluation (Signed)
Anesthesia Post Note  Patient: [name]  Procedure(s) Performed: TOTAL HIP ARTHROPLASTY ANTERIOR APPROACH (Right: Hip)     Patient location during evaluation: PACU Anesthesia Type: Spinal Level of consciousness: awake and alert Pain management: pain level controlled Vital Signs Assessment: post-procedure vital signs reviewed and stable Respiratory status: spontaneous breathing, nonlabored ventilation and respiratory function stable Cardiovascular status: blood pressure returned to baseline and stable Postop Assessment: no apparent nausea or vomiting Anesthetic complications: no   No notable events documented.  Last Vitals:  Vitals:   12/06/21 1057 12/06/21 1115  BP: 127/68 139/70  Pulse: 67 72  Resp: 20   Temp: 37 C   SpO2: 100% 96%    Last Pain:  Vitals:   12/06/21 1115  TempSrc:   PainSc: 6                  Lowella Curb

## 2021-12-06 NOTE — Anesthesia Preprocedure Evaluation (Signed)
Anesthesia Evaluation  Patient identified by MRN, date of birth, ID band Patient awake    Reviewed: Allergy & Precautions, NPO status , Patient's Chart, lab work & pertinent test results  History of Anesthesia Complications (+) PONV and history of anesthetic complications  Airway Mallampati: II  TM Distance: >3 FB Neck ROM: Full    Dental no notable dental hx. (+) Teeth Intact, Dental Advisory Given   Pulmonary neg pulmonary ROS,    Pulmonary exam normal breath sounds clear to auscultation       Cardiovascular hypertension, Pt. on medications negative cardio ROS Normal cardiovascular exam Rhythm:Regular Rate:Normal     Neuro/Psych Anxiety Depression negative neurological ROS  negative psych ROS   GI/Hepatic negative GI ROS, Neg liver ROS,   Endo/Other  Hypothyroidism   Renal/GU negative Renal ROS  negative genitourinary   Musculoskeletal  (+) Arthritis , Osteoarthritis,  Fibromyalgia -  Abdominal Normal abdominal exam  (+) + obese,   Peds negative pediatric ROS (+)  Hematology negative hematology ROS (+)   Anesthesia Other Findings   Reproductive/Obstetrics negative OB ROS                             Lab Results  Component Value Date   WBC 6.4 11/28/2021   HGB 14.0 11/28/2021   HCT 42.7 11/28/2021   MCV 90.5 11/28/2021   PLT 325 11/28/2021   Lab Results  Component Value Date   INR 0.98 02/19/2011     Anesthesia Physical  Anesthesia Plan  ASA: 2  Anesthesia Plan: Spinal   Post-op Pain Management: Regional block*, Tylenol PO (pre-op)* and Minimal or no pain anticipated   Induction: Intravenous  PONV Risk Score and Plan: 3 and Ondansetron, Midazolam, Treatment may vary due to age or medical condition and Propofol infusion  Airway Management Planned: Natural Airway and Simple Face Mask  Additional Equipment: None  Intra-op Plan:   Post-operative Plan: Extubation  in OR  Informed Consent: I have reviewed the patients History and Physical, chart, labs and discussed the procedure including the risks, benefits and alternatives for the proposed anesthesia with the patient or authorized representative who has indicated his/her understanding and acceptance.     Dental advisory given  Plan Discussed with: CRNA  Anesthesia Plan Comments:         Anesthesia Quick Evaluation

## 2021-12-07 ENCOUNTER — Encounter (HOSPITAL_COMMUNITY): Payer: Self-pay | Admitting: Orthopedic Surgery

## 2021-12-07 ENCOUNTER — Ambulatory Visit: Payer: Self-pay | Admitting: Student

## 2022-01-28 ENCOUNTER — Other Ambulatory Visit: Payer: Self-pay | Admitting: Family Medicine

## 2022-01-28 DIAGNOSIS — Z1231 Encounter for screening mammogram for malignant neoplasm of breast: Secondary | ICD-10-CM

## 2022-02-06 ENCOUNTER — Ambulatory Visit
Admission: RE | Admit: 2022-02-06 | Discharge: 2022-02-06 | Disposition: A | Discharge status: Home / Self Care | Payer: Medicare Other | Source: Ambulatory Visit | Attending: Family Medicine | Admitting: Family Medicine

## 2022-02-06 DIAGNOSIS — Z1231 Encounter for screening mammogram for malignant neoplasm of breast: Secondary | ICD-10-CM

## 2022-03-29 ENCOUNTER — Ambulatory Visit (INDEPENDENT_AMBULATORY_CARE_PROVIDER_SITE_OTHER): Payer: Medicare Other | Admitting: Radiology

## 2022-03-29 ENCOUNTER — Encounter: Payer: Self-pay | Admitting: Radiology

## 2022-03-29 VITALS — BP 122/76 | Ht 61.0 in | Wt 199.0 lb

## 2022-03-29 DIAGNOSIS — E559 Vitamin D deficiency, unspecified: Secondary | ICD-10-CM | POA: Diagnosis not present

## 2022-03-29 DIAGNOSIS — Z13820 Encounter for screening for osteoporosis: Secondary | ICD-10-CM

## 2022-03-29 DIAGNOSIS — N951 Menopausal and female climacteric states: Secondary | ICD-10-CM | POA: Diagnosis not present

## 2022-03-29 DIAGNOSIS — Z01419 Encounter for gynecological examination (general) (routine) without abnormal findings: Secondary | ICD-10-CM | POA: Diagnosis not present

## 2022-03-29 DIAGNOSIS — N761 Subacute and chronic vaginitis: Secondary | ICD-10-CM

## 2022-03-29 DIAGNOSIS — R5383 Other fatigue: Secondary | ICD-10-CM | POA: Diagnosis not present

## 2022-03-29 DIAGNOSIS — N959 Unspecified menopausal and perimenopausal disorder: Secondary | ICD-10-CM

## 2022-03-29 MED ORDER — FLUCONAZOLE 150 MG PO TABS: 150.0000 mg | ORAL_TABLET | ORAL | 0 refills | Status: AC

## 2022-03-29 MED ORDER — ESTRADIOL 0.05 MG/24HR TD PTTW: 1.0000 | MEDICATED_PATCH | TRANSDERMAL | 12 refills | Status: DC

## 2022-03-29 NOTE — Progress Notes (Signed)
   [name] 25-Jan-1956 [id number]   History:  66 y.o. G1P1 presents for annual exam with multiple concerns. S/p Hysterectomy Right hip replacement 12/2021 Complains of hot flashes--desires blood work to check hormones and fatigue.  Stopped estrogen patches x's 6 months, wants to restart. Requesting rx refills for fluconazole, chronic vaginal yeast infections.  Health Maintenance Last Pap: 2012. Results were: normal Last mammogram: 02/2022. Results were: normal Last colonoscopy: cologuard 2022 negative Last Dexa: 2019. Results were: normal  Past medical history, past surgical history, family history and social history were all reviewed and documented in the EPIC chart.  ROS:  A ROS was performed and pertinent positives and negatives are included.  Exam:  Vitals:   03/29/22 0801  BP: 122/76  Weight: 199 lb (90.3 kg)  Height: 5\' 1"  (1.549 m)   Body mass index is 37.6 kg/m.  General appearance:  Normal Thyroid:  Symmetrical, normal in size, without palpable masses or nodularity. Respiratory  Auscultation:  Clear without wheezing or rhonchi Cardiovascular  Auscultation:  Regular rate, without rubs, murmurs or gallops  Edema/varicosities:  Not grossly evident Abdominal  Soft,nontender, without masses, guarding or rebound.  Liver/spleen:  No organomegaly noted  Hernia:  None appreciated  Skin  Inspection:  Grossly normal Breasts: Examined lying and sitting.   Right: Without masses, retractions, nipple discharge or axillary adenopathy.   Left: Without masses, retractions, nipple discharge or axillary adenopathy. Genitourinary   Inguinal/mons:  Normal without inguinal adenopathy  External genitalia:  Normal appearing vulva with no masses, tenderness, or lesions  BUS/Urethra/Skene's glands:  Normal  Vagina:  Normal appearing with normal color and discharge, no lesions. Atrophy mild  Cervix:  absent  Uterus:  absent  Adnexa/parametria:     Rt: Normal in size, without masses  or tenderness.   Lt: Normal in size, without masses or tenderness.  Anus and perineum: Normal  Digital rectal exam: Normal sphincter tone without palpated masses or tenderness  Patient informed chaperone available to be present for breast and pelvic exam. Patient has requested no chaperone to be present. Patient has been advised what will be completed during breast and pelvic exam.   Assessment/Plan:   1. Well female exam with routine gynecological exam  2. Other fatigue - Vitamin D (25 hydroxy) - Thyroid Panel With TSH - CBC  3. Vitamin D deficiency, unspecified Hx of deficiency, has not been checked recently - Vitamin D (25 hydroxy)  4. Vasomotor symptoms due to menopause - estradiol (VIVELLE-DOT) 0.05 MG/24HR patch; Place 1 patch (0.05 mg total) onto the skin 2 (two) times a week.  Dispense: 8 patch; Refill: 12  5. Screening for osteoporosis - DG Bone Density; Future  6. Unspecified menopausal and perimenopausal disorder - DG Bone Density; Future  7. Chronic vaginitis - fluconazole (DIFLUCAN) 150 MG tablet; Take 1 tablet (150 mg total) by mouth every 3 (three) days.  Dispense: 3 tablet; Refill: 0    Discussed SBE, colonoscopy and DEXA screening as appropriate. Encouraged 175mins/week of cardiovascular and weight bearing exercise minimum. Recommend the use of seatbelts and sunscreen consistently.   Return in 1 year for annual or sooner prn.  Rubbie Battiest B WHNP-BC 8:48 AM 03/29/2022

## 2022-03-30 LAB — CBC
HCT: 40.9 % (ref 35.0–45.0)
Hemoglobin: 13.5 g/dL (ref 11.7–15.5)
MCH: 29.3 pg (ref 27.0–33.0)
MCHC: 33 g/dL (ref 32.0–36.0)
MCV: 88.7 fL (ref 80.0–100.0)
MPV: 9.8 fL (ref 7.5–12.5)
Platelets: 377 10*3/uL (ref 140–400)
RBC: 4.61 10*6/uL (ref 3.80–5.10)
RDW: 13.1 % (ref 11.0–15.0)
WBC: 7.6 10*3/uL (ref 3.8–10.8)

## 2022-03-30 LAB — VITAMIN D 25 HYDROXY (VIT D DEFICIENCY, FRACTURES): Vit D, 25-Hydroxy: 40 ng/mL (ref 30–100)

## 2022-03-30 LAB — THYROID PANEL WITH TSH
Free Thyroxine Index: 2.4 (ref 1.4–3.8)
T3 Uptake: 31 % (ref 22–35)
T4, Total: 7.8 ug/dL (ref 5.1–11.9)
TSH: 1.21 mIU/L (ref 0.40–4.50)

## 2022-04-01 ENCOUNTER — Telehealth: Payer: Self-pay

## 2022-04-01 NOTE — Telephone Encounter (Addendum)
Willa Frater, NP Friday. Calling for lab results.

## 2022-04-01 NOTE — Telephone Encounter (Signed)
Spoke with patient and informed her. °

## 2022-04-01 NOTE — Telephone Encounter (Signed)
Message was sent this morning to patient via mychart. All labs were normal.

## 2022-04-01 NOTE — Telephone Encounter (Signed)
I read patient your My Chart message. She said she has been back on the patch since Friday. She said the nightsweats are still awful. She said Jami, NP told her it might be better in a "couple of days" but it is not. She said Jami,NP mentioned she might increase the dose.

## 2022-04-01 NOTE — Telephone Encounter (Signed)
It will take weeks for her to notice any difference. It will take time for it to build up in her system. We can look at changing the dose after 6-8 weeks if she has no improvement.

## 2022-05-27 ENCOUNTER — Telehealth: Payer: Self-pay | Admitting: *Deleted

## 2022-05-27 NOTE — Telephone Encounter (Signed)
Call returned to patient.  Patient left message stating hormones prescribed have been helping, but not completely resolving hot flashes. Would like return call to discuss.   Left message to call [facility] Triage, OPT 4 [phone].

## 2022-08-07 ENCOUNTER — Other Ambulatory Visit: Payer: Self-pay | Admitting: Radiology

## 2022-08-07 DIAGNOSIS — N951 Menopausal and female climacteric states: Secondary | ICD-10-CM

## 2022-08-13 NOTE — Telephone Encounter (Signed)
No response from patient.  See refill encounter dated 08/07/22.   Routing to Provider FYI.  Encounter closed.

## 2023-03-27 ENCOUNTER — Other Ambulatory Visit: Payer: Self-pay | Admitting: Family Medicine

## 2023-03-27 DIAGNOSIS — Z1231 Encounter for screening mammogram for malignant neoplasm of breast: Secondary | ICD-10-CM

## 2023-04-08 ENCOUNTER — Ambulatory Visit (INDEPENDENT_AMBULATORY_CARE_PROVIDER_SITE_OTHER): Payer: Medicare Other | Admitting: Radiology

## 2023-04-08 ENCOUNTER — Encounter: Payer: Self-pay | Admitting: Radiology

## 2023-04-08 VITALS — BP 110/82 | Ht 60.5 in | Wt 191.0 lb

## 2023-04-08 DIAGNOSIS — N951 Menopausal and female climacteric states: Secondary | ICD-10-CM | POA: Diagnosis not present

## 2023-04-08 MED ORDER — ESTRADIOL 0.075 MG/24HR TD PTTW: 1.0000 | MEDICATED_PATCH | TRANSDERMAL | 1 refills | Status: DC

## 2023-04-08 NOTE — Progress Notes (Signed)
[name] 13-Aug-1955 [id number]   History:  67 y.o. G1P1 presents for med check. Low risk medicate patient. Last AEX 9/23. S/p Hysterectomy Right hip replacement 12/2021. Restarted estrogen patches last year, doing well, hot flashes have improved but still very hot at night, interested in increasing dose.   Health Maintenance Last Pap: 2012. Results were: normal Last mammogram: 02/2022. Results were: normal, scheduled in 2 weeks Last colonoscopy: cologuard 2022 negative Last Dexa: 2019. Results were: normal, overdue for repeat  Past medical history, past surgical history, family history and social history were all reviewed and documented in the EPIC chart.  ROS:  A ROS was performed and pertinent positives and negatives are included.  Exam:  Vitals:   04/08/23 0811  BP: 110/82  Weight: 191 lb (86.6 kg)  Height: 5' 0.5" (1.537 m)   Body mass index is 36.69 kg/m.  Physical Exam Constitutional:      Appearance: Normal appearance. She is normal weight.  Cardiovascular:     Rate and Rhythm: Normal rate and regular rhythm.     Heart sounds: Normal heart sounds.  Pulmonary:     Effort: Pulmonary effort is normal.     Breath sounds: Normal breath sounds.  Abdominal:     General: Abdomen is flat.  Neurological:     General: No focal deficit present.     Mental Status: She is alert.  Psychiatric:        Mood and Affect: Mood normal.        Thought Content: Thought content normal.        Judgment: Judgment normal.      Assessment/Plan:   1. Vasomotor symptoms due to menopause Will increase dose to manage symptoms. Risks and benefits reviewed Follow up 6 months - estradiol (VIVELLE-DOT) 0.075 MG/24HR; Place 1 patch onto the skin 2 (two) times a week.  Dispense: 24 patch; Refill: 1   2. Screening for osteoporosis - DG Bone Density; Future    Discussed SBE, colonoscopy and DEXA screening as appropriate. Encouraged 118mins/week of cardiovascular and weight bearing  exercise minimum. Recommend the use of seatbelts and sunscreen consistently.    Tanda Rockers WHNP-BC 8:31 AM 04/08/2023

## 2023-04-30 ENCOUNTER — Ambulatory Visit
Admission: RE | Admit: 2023-04-30 | Discharge: 2023-04-30 | Disposition: A | Discharge status: Home / Self Care | Payer: Medicare Other | Source: Ambulatory Visit | Attending: Family Medicine | Admitting: Family Medicine

## 2023-04-30 DIAGNOSIS — Z1231 Encounter for screening mammogram for malignant neoplasm of breast: Secondary | ICD-10-CM

## 2023-09-24 ENCOUNTER — Other Ambulatory Visit: Payer: Self-pay | Admitting: Radiology

## 2023-09-24 DIAGNOSIS — N951 Menopausal and female climacteric states: Secondary | ICD-10-CM

## 2023-09-24 NOTE — Telephone Encounter (Signed)
 Med refill request: estradiol 0.075 mg patch Last OV: 04/08/23 medication check--follow up due 6 months Last AEX: 04/01/22  Next AEX: 10/08/23 Last MMG (if hormonal med) 04/30/23 BI-RADS 1 negative Refill authorized: estradiol 0.075 mg patch #24, zero refills.  Please approve or deny as appropriate.

## 2023-10-08 ENCOUNTER — Ambulatory Visit (INDEPENDENT_AMBULATORY_CARE_PROVIDER_SITE_OTHER): Payer: Medicare Other | Admitting: Radiology

## 2023-10-08 ENCOUNTER — Encounter: Payer: Self-pay | Admitting: Radiology

## 2023-10-08 VITALS — BP 118/68 | HR 72

## 2023-10-08 DIAGNOSIS — E2839 Other primary ovarian failure: Secondary | ICD-10-CM | POA: Diagnosis not present

## 2023-10-08 DIAGNOSIS — N951 Menopausal and female climacteric states: Secondary | ICD-10-CM | POA: Diagnosis not present

## 2023-10-08 DIAGNOSIS — Z13820 Encounter for screening for osteoporosis: Secondary | ICD-10-CM

## 2023-10-08 MED ORDER — ESTRADIOL 0.1 MG/24HR TD PTTW: 1.0000 | MEDICATED_PATCH | TRANSDERMAL | 1 refills | Status: DC

## 2023-10-08 NOTE — Progress Notes (Signed)
   [name] [birth year] [id number]   History:  68 y.o. G1P1 presents for med check. Low risk medicate patient. Last AEX 9/23. S/p Hysterectomy Right hip replacement 12/2021. Restarted estrogen patches last year, doing well, hot flashes have improved but still very hot at night, interested in increasing dose again. Saw PCP yesterday and had thyroid checked per pt.  Health Maintenance Last Pap: 2012. Results were: normal Last mammogram: 02/2022. Results were: normal, scheduled in 2 weeks Last colonoscopy: cologuard 2022 negative Last Dexa: 2019. Results were: normal, overdue for repeat  Past medical history, past surgical history, family history and social history were all reviewed and documented in the EPIC chart.  ROS:  A ROS was performed and pertinent positives and negatives are included.  Exam:  Vitals:   10/08/23 0802  BP: 118/68  Pulse: 72  SpO2: 99%   There is no height or weight on file to calculate BMI.  Physical Exam Constitutional:      Appearance: Normal appearance. She is normal weight.  Pulmonary:     Effort: Pulmonary effort is normal.  Neurological:     General: No focal deficit present.     Mental Status: She is alert.  Psychiatric:        Mood and Affect: Mood normal.        Thought Content: Thought content normal.        Judgment: Judgment normal.      Assessment/Plan:   1. Vasomotor symptoms due to menopause Will increase dose to manage symptoms. Risks and benefits reviewed Follow up 6 months - estradiol (VIVELLE-DOT) 0.1 MG/24HR; Place 1 patch onto the skin 2 (two) times a week.  Dispense: 24 patch; Refill: 1   2. Screening for osteoporosis - DG Bone Density; Future    Discussed SBE, colonoscopy and DEXA screening as appropriate. Encouraged 149mins/week of cardiovascular and weight bearing exercise minimum. Recommend the use of seatbelts and sunscreen consistently.    Tanda Rockers WHNP-BC 8:34 AM 10/08/2023

## 2023-12-19 ENCOUNTER — Other Ambulatory Visit: Payer: Self-pay | Admitting: Radiology

## 2023-12-19 DIAGNOSIS — N951 Menopausal and female climacteric states: Secondary | ICD-10-CM

## 2023-12-19 NOTE — Telephone Encounter (Signed)
.  Med refill request: estradiol  0.075 mg patch Last OV: 10/08/03 RX changed to estradiol  0.1 mg patch Last AEX: 0922/03 Next AEX: none scheduled Last MMG (if hormonal med) 04/30/23 BI-RADS 1 negative Refill denied.  Sent to provider for review.

## 2024-03-24 ENCOUNTER — Other Ambulatory Visit: Payer: Self-pay | Admitting: Radiology

## 2024-03-24 DIAGNOSIS — N951 Menopausal and female climacteric states: Secondary | ICD-10-CM

## 2024-03-25 NOTE — Telephone Encounter (Addendum)
 Med refill request: estradiol  (vivelle -dot) 0.1 mg/24 hr patch Start:  10/09/2023 Dispensed: 24 patches with 1 refill  Last AEX: 03/29/22  Next AEX: 04/06/24 Last MMG (if hormonal med): 04/30/23 Refill authorized? Please Advise.

## 2024-04-06 ENCOUNTER — Encounter: Payer: Self-pay | Admitting: Radiology

## 2024-04-06 ENCOUNTER — Ambulatory Visit (INDEPENDENT_AMBULATORY_CARE_PROVIDER_SITE_OTHER): Admitting: Radiology

## 2024-04-06 VITALS — BP 116/80 | HR 71 | Ht 62.0 in | Wt 170.0 lb

## 2024-04-06 DIAGNOSIS — E2839 Other primary ovarian failure: Secondary | ICD-10-CM | POA: Diagnosis not present

## 2024-04-06 DIAGNOSIS — Z13820 Encounter for screening for osteoporosis: Secondary | ICD-10-CM

## 2024-04-06 DIAGNOSIS — N951 Menopausal and female climacteric states: Secondary | ICD-10-CM

## 2024-04-06 DIAGNOSIS — Z01419 Encounter for gynecological examination (general) (routine) without abnormal findings: Secondary | ICD-10-CM

## 2024-04-06 MED ORDER — ESTRADIOL 0.1 MG/24HR TD PTTW: 1.0000 | MEDICATED_PATCH | TRANSDERMAL | 4 refills | Status: AC

## 2024-04-06 NOTE — Progress Notes (Signed)
   [name] 1956/03/21 [id number]   History:  68 y.o. G1P1 presents for B&P. Low risk medicate patient. S/p Hysterectomy Right hip replacement 12/2021. On estrogen patches last year, doing well, hot flashes have improved. Trying to lose weight.  Health Maintenance Last Pap: 2012. Results were: normal Last mammogram: 10/24. Results were: normal Last colonoscopy: cologuard 2022 negative Last Dexa: 2019. Results were: normal, overdue for repeat  Past medical history, past surgical history, family history and social history were all reviewed and documented in the EPIC chart.  ROS:  A ROS was performed and pertinent positives and negatives are included.  Exam:  Vitals:   04/06/24 0832  BP: 116/80  Pulse: 71  SpO2: 98%  Weight: 170 lb (77.1 kg)  Height: 5' 2 (1.575 m)   Body mass index is 31.09 kg/m.  Physical Exam Exam conducted with a chaperone present.  Constitutional:      Appearance: Normal appearance. She is normal weight.  Pulmonary:     Effort: Pulmonary effort is normal.  Genitourinary:    General: Normal vulva.     Uterus: Absent.      Adnexa: Right adnexa normal and left adnexa normal.  Neurological:     General: No focal deficit present.     Mental Status: She is alert.  Psychiatric:        Mood and Affect: Mood normal.        Thought Content: Thought content normal.        Judgment: Judgment normal.      Assessment/Plan:   1. Encounter for breast and pelvic examination (Primary)  2. Vasomotor symptoms due to menopause - estradiol  (VIVELLE -DOT) 0.1 MG/24HR patch; Place 1 patch (0.1 mg total) onto the skin 2 (two) times a week.  Dispense: 24 patch; Refill: 4  3. Screening for osteoporosis - DG Bone Density; Future     Discussed SBE, colonoscopy and DEXA screening as appropriate. Encouraged 135mins/week of cardiovascular and weight bearing exercise minimum. Increase protein to 100mg /day and fiber to 35mg /day. Recommend the use of seatbelts and  sunscreen consistently.    [name] B WHNP-BC 9:08 AM 04/06/2024

## 2024-05-19 ENCOUNTER — Other Ambulatory Visit: Payer: Self-pay | Admitting: Family Medicine

## 2024-05-19 DIAGNOSIS — Z1231 Encounter for screening mammogram for malignant neoplasm of breast: Secondary | ICD-10-CM

## 2024-06-22 ENCOUNTER — Ambulatory Visit
Admission: RE | Admit: 2024-06-22 | Discharge: 2024-06-22 | Disposition: A | Source: Ambulatory Visit | Attending: Family Medicine | Admitting: Family Medicine

## 2024-06-22 DIAGNOSIS — Z1231 Encounter for screening mammogram for malignant neoplasm of breast: Secondary | ICD-10-CM
